# Patient Record
Sex: Female | Born: 1985
Health system: Southern US, Community
[De-identification: ages and names within clinical notes are randomized; demographics above are authoritative.]

## PROBLEM LIST (undated history)

## (undated) DIAGNOSIS — F32A Depression, unspecified: Secondary | ICD-10-CM

## (undated) DIAGNOSIS — I10 Essential (primary) hypertension: Secondary | ICD-10-CM

## (undated) DIAGNOSIS — J45909 Unspecified asthma, uncomplicated: Secondary | ICD-10-CM

## (undated) DIAGNOSIS — E119 Type 2 diabetes mellitus without complications: Secondary | ICD-10-CM

## (undated) DIAGNOSIS — E785 Hyperlipidemia, unspecified: Secondary | ICD-10-CM

## (undated) DIAGNOSIS — K7460 Unspecified cirrhosis of liver: Secondary | ICD-10-CM

## (undated) DIAGNOSIS — E039 Hypothyroidism, unspecified: Secondary | ICD-10-CM

## (undated) DIAGNOSIS — R079 Chest pain, unspecified: Secondary | ICD-10-CM

## (undated) DIAGNOSIS — F419 Anxiety disorder, unspecified: Secondary | ICD-10-CM

## (undated) DIAGNOSIS — M109 Gout, unspecified: Secondary | ICD-10-CM

## (undated) DIAGNOSIS — R202 Paresthesia of skin: Secondary | ICD-10-CM

## (undated) DIAGNOSIS — F329 Major depressive disorder, single episode, unspecified: Secondary | ICD-10-CM

## (undated) DIAGNOSIS — N184 Chronic kidney disease, stage 4 (severe): Secondary | ICD-10-CM

## (undated) DIAGNOSIS — N189 Chronic kidney disease, unspecified: Secondary | ICD-10-CM

## (undated) HISTORY — DX: Chronic kidney disease, stage 4 (severe): N18.4

## (undated) HISTORY — DX: Essential (primary) hypertension: I10

## (undated) HISTORY — DX: Unspecified asthma, uncomplicated: J45.909

## (undated) HISTORY — DX: Chronic kidney disease, unspecified: N18.9

## (undated) HISTORY — DX: Type 2 diabetes mellitus without complications: E11.9

## (undated) HISTORY — DX: Anxiety disorder, unspecified: F41.9

## (undated) HISTORY — DX: Hyperlipidemia, unspecified: E78.5

## (undated) HISTORY — DX: Major depressive disorder, single episode, unspecified: F32.9

## (undated) HISTORY — DX: Depression, unspecified: F32.A

## (undated) HISTORY — DX: Paresthesia of skin: R20.2

## (undated) HISTORY — DX: Chest pain, unspecified: R07.9

## (undated) HISTORY — DX: Hypothyroidism, unspecified: E03.9

## (undated) HISTORY — DX: Gout, unspecified: M10.9

## (undated) HISTORY — DX: Unspecified cirrhosis of liver: K74.60

---

## 2010-05-11 ENCOUNTER — Ambulatory Visit: Payer: Self-pay | Admitting: Genetic Counselor

## 2011-04-05 DIAGNOSIS — K2100 Gastro-esophageal reflux disease with esophagitis, without bleeding: Secondary | ICD-10-CM | POA: Insufficient documentation

## 2011-04-13 ENCOUNTER — Other Ambulatory Visit: Payer: Self-pay | Admitting: Neurosurgery

## 2011-04-13 DIAGNOSIS — M5126 Other intervertebral disc displacement, lumbar region: Secondary | ICD-10-CM

## 2011-04-14 ENCOUNTER — Ambulatory Visit
Admission: RE | Admit: 2011-04-14 | Discharge: 2011-04-14 | Disposition: A | Discharge status: Home / Self Care | Payer: Medicare Other | Source: Ambulatory Visit | Attending: Neurosurgery | Admitting: Neurosurgery

## 2011-04-14 DIAGNOSIS — M5126 Other intervertebral disc displacement, lumbar region: Secondary | ICD-10-CM

## 2011-07-26 ENCOUNTER — Other Ambulatory Visit: Payer: Self-pay | Admitting: Neurosurgery

## 2011-07-26 DIAGNOSIS — M5126 Other intervertebral disc displacement, lumbar region: Secondary | ICD-10-CM

## 2012-01-17 DIAGNOSIS — G4700 Insomnia, unspecified: Secondary | ICD-10-CM | POA: Diagnosis not present

## 2012-01-17 DIAGNOSIS — R209 Unspecified disturbances of skin sensation: Secondary | ICD-10-CM | POA: Diagnosis not present

## 2012-01-17 DIAGNOSIS — M545 Low back pain: Secondary | ICD-10-CM | POA: Diagnosis not present

## 2012-01-17 DIAGNOSIS — IMO0002 Reserved for concepts with insufficient information to code with codable children: Secondary | ICD-10-CM | POA: Diagnosis not present

## 2012-02-07 DIAGNOSIS — J019 Acute sinusitis, unspecified: Secondary | ICD-10-CM | POA: Diagnosis not present

## 2012-08-07 DIAGNOSIS — E669 Obesity, unspecified: Secondary | ICD-10-CM | POA: Diagnosis not present

## 2012-08-07 DIAGNOSIS — J45902 Unspecified asthma with status asthmaticus: Secondary | ICD-10-CM | POA: Diagnosis not present

## 2012-08-07 DIAGNOSIS — R300 Dysuria: Secondary | ICD-10-CM | POA: Diagnosis not present

## 2012-08-07 DIAGNOSIS — Z3202 Encounter for pregnancy test, result negative: Secondary | ICD-10-CM | POA: Diagnosis not present

## 2012-08-07 DIAGNOSIS — I10 Essential (primary) hypertension: Secondary | ICD-10-CM | POA: Diagnosis not present

## 2012-08-07 DIAGNOSIS — M109 Gout, unspecified: Secondary | ICD-10-CM | POA: Diagnosis not present

## 2012-08-07 DIAGNOSIS — E782 Mixed hyperlipidemia: Secondary | ICD-10-CM | POA: Insufficient documentation

## 2012-08-07 DIAGNOSIS — N3090 Cystitis, unspecified without hematuria: Secondary | ICD-10-CM | POA: Diagnosis not present

## 2013-03-20 DIAGNOSIS — M109 Gout, unspecified: Secondary | ICD-10-CM | POA: Insufficient documentation

## 2013-03-22 DIAGNOSIS — I10 Essential (primary) hypertension: Secondary | ICD-10-CM | POA: Diagnosis not present

## 2013-03-22 DIAGNOSIS — M109 Gout, unspecified: Secondary | ICD-10-CM | POA: Diagnosis not present

## 2013-04-04 DIAGNOSIS — M79609 Pain in unspecified limb: Secondary | ICD-10-CM | POA: Diagnosis not present

## 2013-04-04 DIAGNOSIS — M109 Gout, unspecified: Secondary | ICD-10-CM | POA: Diagnosis not present

## 2013-04-11 DIAGNOSIS — Z79899 Other long term (current) drug therapy: Secondary | ICD-10-CM | POA: Diagnosis not present

## 2013-04-11 DIAGNOSIS — J111 Influenza due to unidentified influenza virus with other respiratory manifestations: Secondary | ICD-10-CM | POA: Diagnosis not present

## 2013-04-11 DIAGNOSIS — N390 Urinary tract infection, site not specified: Secondary | ICD-10-CM | POA: Diagnosis not present

## 2013-04-11 DIAGNOSIS — I10 Essential (primary) hypertension: Secondary | ICD-10-CM | POA: Diagnosis not present

## 2013-05-14 DIAGNOSIS — I10 Essential (primary) hypertension: Secondary | ICD-10-CM | POA: Diagnosis not present

## 2013-05-14 DIAGNOSIS — M109 Gout, unspecified: Secondary | ICD-10-CM | POA: Diagnosis not present

## 2013-09-12 DIAGNOSIS — C44320 Squamous cell carcinoma of skin of unspecified parts of face: Secondary | ICD-10-CM | POA: Diagnosis not present

## 2013-09-12 DIAGNOSIS — H6120 Impacted cerumen, unspecified ear: Secondary | ICD-10-CM | POA: Diagnosis not present

## 2013-09-12 DIAGNOSIS — F411 Generalized anxiety disorder: Secondary | ICD-10-CM | POA: Diagnosis not present

## 2013-09-12 DIAGNOSIS — B079 Viral wart, unspecified: Secondary | ICD-10-CM | POA: Diagnosis not present

## 2013-09-27 DIAGNOSIS — L259 Unspecified contact dermatitis, unspecified cause: Secondary | ICD-10-CM | POA: Diagnosis not present

## 2013-09-27 DIAGNOSIS — Z23 Encounter for immunization: Secondary | ICD-10-CM | POA: Diagnosis not present

## 2013-11-19 DIAGNOSIS — H6120 Impacted cerumen, unspecified ear: Secondary | ICD-10-CM | POA: Diagnosis not present

## 2013-11-19 DIAGNOSIS — H6690 Otitis media, unspecified, unspecified ear: Secondary | ICD-10-CM | POA: Diagnosis not present

## 2013-11-25 DIAGNOSIS — J309 Allergic rhinitis, unspecified: Secondary | ICD-10-CM | POA: Diagnosis not present

## 2013-11-25 DIAGNOSIS — H6690 Otitis media, unspecified, unspecified ear: Secondary | ICD-10-CM | POA: Diagnosis not present

## 2013-12-25 DIAGNOSIS — H9209 Otalgia, unspecified ear: Secondary | ICD-10-CM | POA: Diagnosis not present

## 2013-12-25 DIAGNOSIS — H6120 Impacted cerumen, unspecified ear: Secondary | ICD-10-CM | POA: Diagnosis not present

## 2014-03-08 DIAGNOSIS — Z87891 Personal history of nicotine dependence: Secondary | ICD-10-CM | POA: Diagnosis not present

## 2014-03-08 DIAGNOSIS — I10 Essential (primary) hypertension: Secondary | ICD-10-CM | POA: Diagnosis not present

## 2014-03-08 DIAGNOSIS — R109 Unspecified abdominal pain: Secondary | ICD-10-CM | POA: Diagnosis not present

## 2014-03-08 DIAGNOSIS — R1033 Periumbilical pain: Secondary | ICD-10-CM | POA: Diagnosis not present

## 2014-03-08 DIAGNOSIS — R112 Nausea with vomiting, unspecified: Secondary | ICD-10-CM | POA: Diagnosis not present

## 2014-03-08 DIAGNOSIS — Z79899 Other long term (current) drug therapy: Secondary | ICD-10-CM | POA: Diagnosis not present

## 2014-03-08 DIAGNOSIS — F411 Generalized anxiety disorder: Secondary | ICD-10-CM | POA: Diagnosis not present

## 2014-03-08 DIAGNOSIS — E7800 Pure hypercholesterolemia, unspecified: Secondary | ICD-10-CM | POA: Diagnosis not present

## 2014-03-12 DIAGNOSIS — E669 Obesity, unspecified: Secondary | ICD-10-CM | POA: Diagnosis not present

## 2014-03-12 DIAGNOSIS — E782 Mixed hyperlipidemia: Secondary | ICD-10-CM | POA: Diagnosis not present

## 2014-03-12 DIAGNOSIS — E039 Hypothyroidism, unspecified: Secondary | ICD-10-CM | POA: Diagnosis not present

## 2014-03-12 DIAGNOSIS — IMO0001 Reserved for inherently not codable concepts without codable children: Secondary | ICD-10-CM | POA: Diagnosis not present

## 2014-03-12 DIAGNOSIS — M109 Gout, unspecified: Secondary | ICD-10-CM | POA: Diagnosis not present

## 2014-03-12 DIAGNOSIS — J45902 Unspecified asthma with status asthmaticus: Secondary | ICD-10-CM | POA: Diagnosis not present

## 2014-03-12 DIAGNOSIS — I10 Essential (primary) hypertension: Secondary | ICD-10-CM | POA: Diagnosis not present

## 2014-04-14 DIAGNOSIS — E039 Hypothyroidism, unspecified: Secondary | ICD-10-CM | POA: Diagnosis not present

## 2014-04-14 DIAGNOSIS — IMO0001 Reserved for inherently not codable concepts without codable children: Secondary | ICD-10-CM | POA: Diagnosis not present

## 2014-04-14 DIAGNOSIS — E782 Mixed hyperlipidemia: Secondary | ICD-10-CM | POA: Diagnosis not present

## 2014-04-14 DIAGNOSIS — M109 Gout, unspecified: Secondary | ICD-10-CM | POA: Diagnosis not present

## 2014-04-14 DIAGNOSIS — J45902 Unspecified asthma with status asthmaticus: Secondary | ICD-10-CM | POA: Diagnosis not present

## 2014-04-14 DIAGNOSIS — I10 Essential (primary) hypertension: Secondary | ICD-10-CM | POA: Diagnosis not present

## 2014-04-14 DIAGNOSIS — E669 Obesity, unspecified: Secondary | ICD-10-CM | POA: Diagnosis not present

## 2014-04-28 DIAGNOSIS — N926 Irregular menstruation, unspecified: Secondary | ICD-10-CM | POA: Diagnosis not present

## 2014-04-28 DIAGNOSIS — E039 Hypothyroidism, unspecified: Secondary | ICD-10-CM | POA: Diagnosis not present

## 2014-04-28 DIAGNOSIS — N939 Abnormal uterine and vaginal bleeding, unspecified: Secondary | ICD-10-CM | POA: Diagnosis not present

## 2014-04-28 DIAGNOSIS — Z3202 Encounter for pregnancy test, result negative: Secondary | ICD-10-CM | POA: Diagnosis not present

## 2014-05-02 DIAGNOSIS — D259 Leiomyoma of uterus, unspecified: Secondary | ICD-10-CM | POA: Diagnosis not present

## 2014-05-02 DIAGNOSIS — E039 Hypothyroidism, unspecified: Secondary | ICD-10-CM | POA: Diagnosis not present

## 2014-05-02 DIAGNOSIS — N926 Irregular menstruation, unspecified: Secondary | ICD-10-CM | POA: Diagnosis not present

## 2014-05-20 DIAGNOSIS — H6500 Acute serous otitis media, unspecified ear: Secondary | ICD-10-CM | POA: Diagnosis not present

## 2014-05-24 DIAGNOSIS — R059 Cough, unspecified: Secondary | ICD-10-CM | POA: Diagnosis not present

## 2014-05-24 DIAGNOSIS — Z79899 Other long term (current) drug therapy: Secondary | ICD-10-CM | POA: Diagnosis not present

## 2014-05-24 DIAGNOSIS — F411 Generalized anxiety disorder: Secondary | ICD-10-CM | POA: Diagnosis not present

## 2014-05-24 DIAGNOSIS — I10 Essential (primary) hypertension: Secondary | ICD-10-CM | POA: Diagnosis not present

## 2014-05-24 DIAGNOSIS — R05 Cough: Secondary | ICD-10-CM | POA: Diagnosis not present

## 2014-05-24 DIAGNOSIS — J45909 Unspecified asthma, uncomplicated: Secondary | ICD-10-CM | POA: Diagnosis not present

## 2014-05-24 DIAGNOSIS — Z862 Personal history of diseases of the blood and blood-forming organs and certain disorders involving the immune mechanism: Secondary | ICD-10-CM | POA: Diagnosis not present

## 2014-05-24 DIAGNOSIS — E7800 Pure hypercholesterolemia, unspecified: Secondary | ICD-10-CM | POA: Diagnosis not present

## 2014-05-24 DIAGNOSIS — Z8639 Personal history of other endocrine, nutritional and metabolic disease: Secondary | ICD-10-CM | POA: Diagnosis not present

## 2014-05-24 DIAGNOSIS — J069 Acute upper respiratory infection, unspecified: Secondary | ICD-10-CM | POA: Diagnosis not present

## 2014-05-26 DIAGNOSIS — R062 Wheezing: Secondary | ICD-10-CM | POA: Diagnosis not present

## 2014-05-26 DIAGNOSIS — J45901 Unspecified asthma with (acute) exacerbation: Secondary | ICD-10-CM | POA: Diagnosis not present

## 2014-05-26 DIAGNOSIS — R05 Cough: Secondary | ICD-10-CM | POA: Diagnosis not present

## 2014-05-26 DIAGNOSIS — R059 Cough, unspecified: Secondary | ICD-10-CM | POA: Diagnosis not present

## 2014-05-27 DIAGNOSIS — R059 Cough, unspecified: Secondary | ICD-10-CM | POA: Diagnosis not present

## 2014-05-27 DIAGNOSIS — R062 Wheezing: Secondary | ICD-10-CM | POA: Diagnosis not present

## 2014-05-27 DIAGNOSIS — R05 Cough: Secondary | ICD-10-CM | POA: Diagnosis not present

## 2014-05-27 DIAGNOSIS — J45901 Unspecified asthma with (acute) exacerbation: Secondary | ICD-10-CM | POA: Diagnosis not present

## 2014-08-21 DIAGNOSIS — Z23 Encounter for immunization: Secondary | ICD-10-CM | POA: Diagnosis not present

## 2014-08-21 DIAGNOSIS — E782 Mixed hyperlipidemia: Secondary | ICD-10-CM | POA: Diagnosis not present

## 2014-08-21 DIAGNOSIS — M109 Gout, unspecified: Secondary | ICD-10-CM | POA: Diagnosis not present

## 2014-08-21 DIAGNOSIS — E669 Obesity, unspecified: Secondary | ICD-10-CM | POA: Diagnosis not present

## 2014-08-21 DIAGNOSIS — I10 Essential (primary) hypertension: Secondary | ICD-10-CM | POA: Diagnosis not present

## 2014-08-21 DIAGNOSIS — E039 Hypothyroidism, unspecified: Secondary | ICD-10-CM | POA: Diagnosis not present

## 2014-08-21 DIAGNOSIS — IMO0001 Reserved for inherently not codable concepts without codable children: Secondary | ICD-10-CM | POA: Diagnosis not present

## 2014-08-21 DIAGNOSIS — J45902 Unspecified asthma with status asthmaticus: Secondary | ICD-10-CM | POA: Diagnosis not present

## 2014-09-13 DIAGNOSIS — M25571 Pain in right ankle and joints of right foot: Secondary | ICD-10-CM | POA: Diagnosis not present

## 2014-09-13 DIAGNOSIS — I10 Essential (primary) hypertension: Secondary | ICD-10-CM | POA: Diagnosis not present

## 2014-09-13 DIAGNOSIS — M25572 Pain in left ankle and joints of left foot: Secondary | ICD-10-CM | POA: Diagnosis not present

## 2014-09-13 DIAGNOSIS — Z87891 Personal history of nicotine dependence: Secondary | ICD-10-CM | POA: Diagnosis not present

## 2014-09-13 DIAGNOSIS — K219 Gastro-esophageal reflux disease without esophagitis: Secondary | ICD-10-CM | POA: Diagnosis not present

## 2014-09-13 DIAGNOSIS — M109 Gout, unspecified: Secondary | ICD-10-CM | POA: Diagnosis not present

## 2014-09-13 DIAGNOSIS — Z79899 Other long term (current) drug therapy: Secondary | ICD-10-CM | POA: Diagnosis not present

## 2014-09-15 DIAGNOSIS — D259 Leiomyoma of uterus, unspecified: Secondary | ICD-10-CM | POA: Diagnosis not present

## 2014-09-15 DIAGNOSIS — N912 Amenorrhea, unspecified: Secondary | ICD-10-CM | POA: Diagnosis not present

## 2014-09-15 DIAGNOSIS — K143 Hypertrophy of tongue papillae: Secondary | ICD-10-CM | POA: Diagnosis not present

## 2014-09-15 DIAGNOSIS — K120 Recurrent oral aphthae: Secondary | ICD-10-CM | POA: Diagnosis not present

## 2014-10-01 DIAGNOSIS — I10 Essential (primary) hypertension: Secondary | ICD-10-CM | POA: Diagnosis not present

## 2014-10-01 DIAGNOSIS — B370 Candidal stomatitis: Secondary | ICD-10-CM | POA: Diagnosis not present

## 2014-10-01 DIAGNOSIS — M109 Gout, unspecified: Secondary | ICD-10-CM | POA: Diagnosis not present

## 2014-10-01 DIAGNOSIS — E1165 Type 2 diabetes mellitus with hyperglycemia: Secondary | ICD-10-CM | POA: Diagnosis not present

## 2014-10-01 DIAGNOSIS — E039 Hypothyroidism, unspecified: Secondary | ICD-10-CM | POA: Diagnosis not present

## 2014-11-10 DIAGNOSIS — M1A09X0 Idiopathic chronic gout, multiple sites, without tophus (tophi): Secondary | ICD-10-CM | POA: Diagnosis not present

## 2014-11-12 DIAGNOSIS — M545 Low back pain: Secondary | ICD-10-CM | POA: Diagnosis not present

## 2014-11-12 DIAGNOSIS — R8290 Unspecified abnormal findings in urine: Secondary | ICD-10-CM | POA: Diagnosis not present

## 2015-04-12 DIAGNOSIS — J45909 Unspecified asthma, uncomplicated: Secondary | ICD-10-CM | POA: Diagnosis not present

## 2015-04-12 DIAGNOSIS — M79671 Pain in right foot: Secondary | ICD-10-CM | POA: Diagnosis not present

## 2015-04-12 DIAGNOSIS — Z87891 Personal history of nicotine dependence: Secondary | ICD-10-CM | POA: Diagnosis not present

## 2015-04-12 DIAGNOSIS — M1A00X0 Idiopathic chronic gout, unspecified site, without tophus (tophi): Secondary | ICD-10-CM | POA: Diagnosis not present

## 2015-04-12 DIAGNOSIS — I10 Essential (primary) hypertension: Secondary | ICD-10-CM | POA: Diagnosis not present

## 2015-04-12 DIAGNOSIS — M10071 Idiopathic gout, right ankle and foot: Secondary | ICD-10-CM | POA: Diagnosis not present

## 2015-04-12 DIAGNOSIS — Z79899 Other long term (current) drug therapy: Secondary | ICD-10-CM | POA: Diagnosis not present

## 2015-04-16 DIAGNOSIS — M109 Gout, unspecified: Secondary | ICD-10-CM | POA: Diagnosis not present

## 2015-04-16 DIAGNOSIS — I10 Essential (primary) hypertension: Secondary | ICD-10-CM | POA: Diagnosis not present

## 2015-04-16 DIAGNOSIS — F419 Anxiety disorder, unspecified: Secondary | ICD-10-CM | POA: Diagnosis not present

## 2015-04-16 DIAGNOSIS — Z1389 Encounter for screening for other disorder: Secondary | ICD-10-CM | POA: Diagnosis not present

## 2015-04-16 DIAGNOSIS — E039 Hypothyroidism, unspecified: Secondary | ICD-10-CM | POA: Diagnosis not present

## 2015-04-16 DIAGNOSIS — E782 Mixed hyperlipidemia: Secondary | ICD-10-CM | POA: Diagnosis not present

## 2015-04-16 DIAGNOSIS — K121 Other forms of stomatitis: Secondary | ICD-10-CM | POA: Diagnosis not present

## 2015-04-16 DIAGNOSIS — E669 Obesity, unspecified: Secondary | ICD-10-CM | POA: Diagnosis not present

## 2015-04-16 DIAGNOSIS — E1165 Type 2 diabetes mellitus with hyperglycemia: Secondary | ICD-10-CM | POA: Diagnosis not present

## 2015-04-27 DIAGNOSIS — E1165 Type 2 diabetes mellitus with hyperglycemia: Secondary | ICD-10-CM | POA: Diagnosis not present

## 2015-05-19 DIAGNOSIS — J019 Acute sinusitis, unspecified: Secondary | ICD-10-CM | POA: Diagnosis not present

## 2015-05-21 DIAGNOSIS — J45901 Unspecified asthma with (acute) exacerbation: Secondary | ICD-10-CM | POA: Insufficient documentation

## 2015-05-22 DIAGNOSIS — J4521 Mild intermittent asthma with (acute) exacerbation: Secondary | ICD-10-CM | POA: Diagnosis not present

## 2015-05-22 DIAGNOSIS — J019 Acute sinusitis, unspecified: Secondary | ICD-10-CM | POA: Diagnosis not present

## 2015-05-25 DIAGNOSIS — D696 Thrombocytopenia, unspecified: Secondary | ICD-10-CM | POA: Diagnosis not present

## 2015-05-25 DIAGNOSIS — K7581 Nonalcoholic steatohepatitis (NASH): Secondary | ICD-10-CM | POA: Diagnosis not present

## 2015-05-25 DIAGNOSIS — M1A00X0 Idiopathic chronic gout, unspecified site, without tophus (tophi): Secondary | ICD-10-CM | POA: Diagnosis not present

## 2015-05-25 DIAGNOSIS — Z79899 Other long term (current) drug therapy: Secondary | ICD-10-CM | POA: Diagnosis not present

## 2015-05-25 DIAGNOSIS — Z6841 Body Mass Index (BMI) 40.0 and over, adult: Secondary | ICD-10-CM | POA: Diagnosis not present

## 2015-05-25 DIAGNOSIS — J45909 Unspecified asthma, uncomplicated: Secondary | ICD-10-CM | POA: Diagnosis not present

## 2015-05-25 DIAGNOSIS — R739 Hyperglycemia, unspecified: Secondary | ICD-10-CM | POA: Diagnosis not present

## 2015-05-25 DIAGNOSIS — Z882 Allergy status to sulfonamides status: Secondary | ICD-10-CM | POA: Diagnosis not present

## 2015-05-25 DIAGNOSIS — Z7952 Long term (current) use of systemic steroids: Secondary | ICD-10-CM | POA: Diagnosis not present

## 2015-05-25 DIAGNOSIS — G4700 Insomnia, unspecified: Secondary | ICD-10-CM | POA: Diagnosis not present

## 2015-05-25 DIAGNOSIS — Z7951 Long term (current) use of inhaled steroids: Secondary | ICD-10-CM | POA: Diagnosis not present

## 2015-05-25 DIAGNOSIS — F419 Anxiety disorder, unspecified: Secondary | ICD-10-CM | POA: Diagnosis not present

## 2015-05-25 DIAGNOSIS — K219 Gastro-esophageal reflux disease without esophagitis: Secondary | ICD-10-CM | POA: Diagnosis not present

## 2015-05-25 DIAGNOSIS — E785 Hyperlipidemia, unspecified: Secondary | ICD-10-CM | POA: Diagnosis not present

## 2015-05-25 DIAGNOSIS — E282 Polycystic ovarian syndrome: Secondary | ICD-10-CM | POA: Diagnosis not present

## 2015-05-25 DIAGNOSIS — E119 Type 2 diabetes mellitus without complications: Secondary | ICD-10-CM | POA: Diagnosis not present

## 2015-05-25 DIAGNOSIS — N179 Acute kidney failure, unspecified: Secondary | ICD-10-CM | POA: Diagnosis not present

## 2015-05-25 DIAGNOSIS — I10 Essential (primary) hypertension: Secondary | ICD-10-CM | POA: Diagnosis not present

## 2015-05-25 DIAGNOSIS — J449 Chronic obstructive pulmonary disease, unspecified: Secondary | ICD-10-CM | POA: Diagnosis not present

## 2015-05-26 DIAGNOSIS — N170 Acute kidney failure with tubular necrosis: Secondary | ICD-10-CM | POA: Diagnosis not present

## 2015-05-27 DIAGNOSIS — I10 Essential (primary) hypertension: Secondary | ICD-10-CM | POA: Diagnosis not present

## 2015-05-27 DIAGNOSIS — E1165 Type 2 diabetes mellitus with hyperglycemia: Secondary | ICD-10-CM | POA: Diagnosis not present

## 2015-05-27 DIAGNOSIS — E860 Dehydration: Secondary | ICD-10-CM | POA: Diagnosis not present

## 2015-05-27 DIAGNOSIS — M109 Gout, unspecified: Secondary | ICD-10-CM | POA: Diagnosis not present

## 2015-05-27 DIAGNOSIS — E669 Obesity, unspecified: Secondary | ICD-10-CM | POA: Diagnosis not present

## 2015-05-27 DIAGNOSIS — E039 Hypothyroidism, unspecified: Secondary | ICD-10-CM | POA: Diagnosis not present

## 2015-05-27 DIAGNOSIS — F419 Anxiety disorder, unspecified: Secondary | ICD-10-CM | POA: Diagnosis not present

## 2015-05-27 DIAGNOSIS — E782 Mixed hyperlipidemia: Secondary | ICD-10-CM | POA: Diagnosis not present

## 2015-05-27 DIAGNOSIS — R197 Diarrhea, unspecified: Secondary | ICD-10-CM | POA: Diagnosis not present

## 2015-05-28 DIAGNOSIS — E1165 Type 2 diabetes mellitus with hyperglycemia: Secondary | ICD-10-CM | POA: Diagnosis not present

## 2015-06-25 DIAGNOSIS — E782 Mixed hyperlipidemia: Secondary | ICD-10-CM | POA: Diagnosis not present

## 2015-06-25 DIAGNOSIS — M109 Gout, unspecified: Secondary | ICD-10-CM | POA: Diagnosis not present

## 2015-06-25 DIAGNOSIS — I10 Essential (primary) hypertension: Secondary | ICD-10-CM | POA: Diagnosis not present

## 2015-06-25 DIAGNOSIS — E1165 Type 2 diabetes mellitus with hyperglycemia: Secondary | ICD-10-CM | POA: Diagnosis not present

## 2015-06-25 DIAGNOSIS — R197 Diarrhea, unspecified: Secondary | ICD-10-CM | POA: Diagnosis not present

## 2015-06-25 DIAGNOSIS — E669 Obesity, unspecified: Secondary | ICD-10-CM | POA: Diagnosis not present

## 2015-06-25 DIAGNOSIS — F419 Anxiety disorder, unspecified: Secondary | ICD-10-CM | POA: Diagnosis not present

## 2015-06-25 DIAGNOSIS — E039 Hypothyroidism, unspecified: Secondary | ICD-10-CM | POA: Diagnosis not present

## 2015-06-25 DIAGNOSIS — E860 Dehydration: Secondary | ICD-10-CM | POA: Diagnosis not present

## 2015-06-27 DIAGNOSIS — E780 Pure hypercholesterolemia: Secondary | ICD-10-CM | POA: Diagnosis not present

## 2015-07-28 DIAGNOSIS — E780 Pure hypercholesterolemia: Secondary | ICD-10-CM | POA: Diagnosis not present

## 2015-08-20 DIAGNOSIS — F419 Anxiety disorder, unspecified: Secondary | ICD-10-CM | POA: Diagnosis not present

## 2015-08-20 DIAGNOSIS — M25552 Pain in left hip: Secondary | ICD-10-CM | POA: Diagnosis not present

## 2015-08-20 DIAGNOSIS — E782 Mixed hyperlipidemia: Secondary | ICD-10-CM | POA: Diagnosis not present

## 2015-08-20 DIAGNOSIS — E039 Hypothyroidism, unspecified: Secondary | ICD-10-CM | POA: Diagnosis not present

## 2015-08-20 DIAGNOSIS — B079 Viral wart, unspecified: Secondary | ICD-10-CM | POA: Diagnosis not present

## 2015-08-20 DIAGNOSIS — M109 Gout, unspecified: Secondary | ICD-10-CM | POA: Diagnosis not present

## 2015-08-20 DIAGNOSIS — F329 Major depressive disorder, single episode, unspecified: Secondary | ICD-10-CM | POA: Diagnosis not present

## 2015-08-20 DIAGNOSIS — I10 Essential (primary) hypertension: Secondary | ICD-10-CM | POA: Diagnosis not present

## 2015-08-20 DIAGNOSIS — E1165 Type 2 diabetes mellitus with hyperglycemia: Secondary | ICD-10-CM | POA: Diagnosis not present

## 2015-08-26 DIAGNOSIS — R7309 Other abnormal glucose: Secondary | ICD-10-CM | POA: Diagnosis not present

## 2015-08-26 DIAGNOSIS — J45909 Unspecified asthma, uncomplicated: Secondary | ICD-10-CM | POA: Diagnosis not present

## 2015-08-26 DIAGNOSIS — Z79899 Other long term (current) drug therapy: Secondary | ICD-10-CM | POA: Diagnosis not present

## 2015-08-26 DIAGNOSIS — I10 Essential (primary) hypertension: Secondary | ICD-10-CM | POA: Diagnosis not present

## 2015-08-26 DIAGNOSIS — G8929 Other chronic pain: Secondary | ICD-10-CM | POA: Diagnosis not present

## 2015-08-26 DIAGNOSIS — M25561 Pain in right knee: Secondary | ICD-10-CM | POA: Diagnosis not present

## 2015-08-26 DIAGNOSIS — Z7952 Long term (current) use of systemic steroids: Secondary | ICD-10-CM | POA: Diagnosis not present

## 2015-08-28 DIAGNOSIS — E039 Hypothyroidism, unspecified: Secondary | ICD-10-CM | POA: Diagnosis not present

## 2015-08-30 DIAGNOSIS — F419 Anxiety disorder, unspecified: Secondary | ICD-10-CM | POA: Diagnosis not present

## 2015-08-30 DIAGNOSIS — Z7952 Long term (current) use of systemic steroids: Secondary | ICD-10-CM | POA: Diagnosis not present

## 2015-08-30 DIAGNOSIS — M109 Gout, unspecified: Secondary | ICD-10-CM | POA: Diagnosis not present

## 2015-08-30 DIAGNOSIS — J45909 Unspecified asthma, uncomplicated: Secondary | ICD-10-CM | POA: Diagnosis not present

## 2015-08-30 DIAGNOSIS — I10 Essential (primary) hypertension: Secondary | ICD-10-CM | POA: Diagnosis not present

## 2015-08-30 DIAGNOSIS — E780 Pure hypercholesterolemia: Secondary | ICD-10-CM | POA: Diagnosis not present

## 2015-08-30 DIAGNOSIS — E039 Hypothyroidism, unspecified: Secondary | ICD-10-CM | POA: Diagnosis not present

## 2015-08-30 DIAGNOSIS — K219 Gastro-esophageal reflux disease without esophagitis: Secondary | ICD-10-CM | POA: Diagnosis not present

## 2015-08-30 DIAGNOSIS — M25571 Pain in right ankle and joints of right foot: Secondary | ICD-10-CM | POA: Diagnosis not present

## 2015-08-30 DIAGNOSIS — Z79899 Other long term (current) drug therapy: Secondary | ICD-10-CM | POA: Diagnosis not present

## 2015-08-30 DIAGNOSIS — M10071 Idiopathic gout, right ankle and foot: Secondary | ICD-10-CM | POA: Diagnosis not present

## 2015-08-30 DIAGNOSIS — Z87891 Personal history of nicotine dependence: Secondary | ICD-10-CM | POA: Diagnosis not present

## 2015-09-18 DIAGNOSIS — E109 Type 1 diabetes mellitus without complications: Secondary | ICD-10-CM | POA: Diagnosis not present

## 2015-09-18 DIAGNOSIS — Z0100 Encounter for examination of eyes and vision without abnormal findings: Secondary | ICD-10-CM | POA: Diagnosis not present

## 2015-09-27 DIAGNOSIS — J45909 Unspecified asthma, uncomplicated: Secondary | ICD-10-CM | POA: Diagnosis not present

## 2015-10-02 DIAGNOSIS — M25552 Pain in left hip: Secondary | ICD-10-CM | POA: Diagnosis not present

## 2015-10-02 DIAGNOSIS — Z23 Encounter for immunization: Secondary | ICD-10-CM | POA: Diagnosis not present

## 2015-10-29 DIAGNOSIS — E119 Type 2 diabetes mellitus without complications: Secondary | ICD-10-CM | POA: Diagnosis not present

## 2015-10-29 DIAGNOSIS — J45909 Unspecified asthma, uncomplicated: Secondary | ICD-10-CM | POA: Diagnosis not present

## 2015-11-16 DIAGNOSIS — R05 Cough: Secondary | ICD-10-CM | POA: Diagnosis not present

## 2015-11-16 DIAGNOSIS — G4700 Insomnia, unspecified: Secondary | ICD-10-CM | POA: Diagnosis not present

## 2015-11-16 DIAGNOSIS — Z760 Encounter for issue of repeat prescription: Secondary | ICD-10-CM | POA: Diagnosis not present

## 2015-11-28 DIAGNOSIS — J45909 Unspecified asthma, uncomplicated: Secondary | ICD-10-CM | POA: Diagnosis not present

## 2015-12-02 DIAGNOSIS — E039 Hypothyroidism, unspecified: Secondary | ICD-10-CM | POA: Diagnosis not present

## 2015-12-02 DIAGNOSIS — F419 Anxiety disorder, unspecified: Secondary | ICD-10-CM | POA: Diagnosis not present

## 2015-12-02 DIAGNOSIS — M4306 Spondylolysis, lumbar region: Secondary | ICD-10-CM | POA: Diagnosis not present

## 2015-12-02 DIAGNOSIS — M109 Gout, unspecified: Secondary | ICD-10-CM | POA: Diagnosis not present

## 2015-12-02 DIAGNOSIS — M5136 Other intervertebral disc degeneration, lumbar region: Secondary | ICD-10-CM | POA: Diagnosis not present

## 2015-12-02 DIAGNOSIS — M545 Low back pain: Secondary | ICD-10-CM | POA: Diagnosis not present

## 2015-12-02 DIAGNOSIS — E1165 Type 2 diabetes mellitus with hyperglycemia: Secondary | ICD-10-CM | POA: Diagnosis not present

## 2015-12-02 DIAGNOSIS — E782 Mixed hyperlipidemia: Secondary | ICD-10-CM | POA: Diagnosis not present

## 2015-12-02 DIAGNOSIS — M9983 Other biomechanical lesions of lumbar region: Secondary | ICD-10-CM | POA: Diagnosis not present

## 2015-12-02 DIAGNOSIS — I10 Essential (primary) hypertension: Secondary | ICD-10-CM | POA: Diagnosis not present

## 2015-12-02 DIAGNOSIS — E669 Obesity, unspecified: Secondary | ICD-10-CM | POA: Diagnosis not present

## 2016-01-26 DIAGNOSIS — M4806 Spinal stenosis, lumbar region: Secondary | ICD-10-CM | POA: Diagnosis not present

## 2016-01-26 DIAGNOSIS — M5127 Other intervertebral disc displacement, lumbosacral region: Secondary | ICD-10-CM | POA: Diagnosis not present

## 2016-01-26 DIAGNOSIS — M5126 Other intervertebral disc displacement, lumbar region: Secondary | ICD-10-CM | POA: Diagnosis not present

## 2016-02-02 DIAGNOSIS — E782 Mixed hyperlipidemia: Secondary | ICD-10-CM | POA: Diagnosis not present

## 2016-02-02 DIAGNOSIS — F329 Major depressive disorder, single episode, unspecified: Secondary | ICD-10-CM | POA: Diagnosis not present

## 2016-02-02 DIAGNOSIS — I10 Essential (primary) hypertension: Secondary | ICD-10-CM | POA: Diagnosis not present

## 2016-02-02 DIAGNOSIS — M545 Low back pain: Secondary | ICD-10-CM | POA: Diagnosis not present

## 2016-02-02 DIAGNOSIS — F419 Anxiety disorder, unspecified: Secondary | ICD-10-CM | POA: Diagnosis not present

## 2016-02-02 DIAGNOSIS — E1165 Type 2 diabetes mellitus with hyperglycemia: Secondary | ICD-10-CM | POA: Diagnosis not present

## 2016-02-02 DIAGNOSIS — M109 Gout, unspecified: Secondary | ICD-10-CM | POA: Diagnosis not present

## 2016-02-02 DIAGNOSIS — E669 Obesity, unspecified: Secondary | ICD-10-CM | POA: Diagnosis not present

## 2016-02-02 DIAGNOSIS — E039 Hypothyroidism, unspecified: Secondary | ICD-10-CM | POA: Diagnosis not present

## 2016-02-04 DIAGNOSIS — Z6841 Body Mass Index (BMI) 40.0 and over, adult: Secondary | ICD-10-CM | POA: Diagnosis not present

## 2016-02-04 DIAGNOSIS — M549 Dorsalgia, unspecified: Secondary | ICD-10-CM | POA: Diagnosis not present

## 2016-02-04 DIAGNOSIS — R030 Elevated blood-pressure reading, without diagnosis of hypertension: Secondary | ICD-10-CM | POA: Diagnosis not present

## 2016-05-26 DIAGNOSIS — M109 Gout, unspecified: Secondary | ICD-10-CM | POA: Diagnosis not present

## 2016-05-26 DIAGNOSIS — M545 Low back pain: Secondary | ICD-10-CM | POA: Diagnosis not present

## 2016-05-26 DIAGNOSIS — F329 Major depressive disorder, single episode, unspecified: Secondary | ICD-10-CM | POA: Diagnosis not present

## 2016-05-26 DIAGNOSIS — E1165 Type 2 diabetes mellitus with hyperglycemia: Secondary | ICD-10-CM | POA: Diagnosis not present

## 2016-05-26 DIAGNOSIS — E039 Hypothyroidism, unspecified: Secondary | ICD-10-CM | POA: Diagnosis not present

## 2016-05-26 DIAGNOSIS — E669 Obesity, unspecified: Secondary | ICD-10-CM | POA: Diagnosis not present

## 2016-05-26 DIAGNOSIS — F419 Anxiety disorder, unspecified: Secondary | ICD-10-CM | POA: Diagnosis not present

## 2016-05-26 DIAGNOSIS — E782 Mixed hyperlipidemia: Secondary | ICD-10-CM | POA: Diagnosis not present

## 2016-05-26 DIAGNOSIS — I10 Essential (primary) hypertension: Secondary | ICD-10-CM | POA: Diagnosis not present

## 2016-06-20 DIAGNOSIS — R102 Pelvic and perineal pain: Secondary | ICD-10-CM | POA: Diagnosis not present

## 2016-06-20 DIAGNOSIS — E282 Polycystic ovarian syndrome: Secondary | ICD-10-CM | POA: Diagnosis not present

## 2016-06-20 DIAGNOSIS — N930 Postcoital and contact bleeding: Secondary | ICD-10-CM | POA: Diagnosis not present

## 2016-07-27 DIAGNOSIS — N926 Irregular menstruation, unspecified: Secondary | ICD-10-CM | POA: Diagnosis not present

## 2016-07-27 DIAGNOSIS — E282 Polycystic ovarian syndrome: Secondary | ICD-10-CM | POA: Diagnosis not present

## 2016-07-27 DIAGNOSIS — R102 Pelvic and perineal pain: Secondary | ICD-10-CM | POA: Diagnosis not present

## 2016-07-27 DIAGNOSIS — N930 Postcoital and contact bleeding: Secondary | ICD-10-CM | POA: Diagnosis not present

## 2016-07-27 DIAGNOSIS — N83201 Unspecified ovarian cyst, right side: Secondary | ICD-10-CM | POA: Diagnosis not present

## 2016-07-27 DIAGNOSIS — N941 Unspecified dyspareunia: Secondary | ICD-10-CM | POA: Diagnosis not present

## 2016-07-27 DIAGNOSIS — N83299 Other ovarian cyst, unspecified side: Secondary | ICD-10-CM | POA: Diagnosis not present

## 2016-09-01 DIAGNOSIS — E669 Obesity, unspecified: Secondary | ICD-10-CM | POA: Diagnosis not present

## 2016-09-01 DIAGNOSIS — Z6841 Body Mass Index (BMI) 40.0 and over, adult: Secondary | ICD-10-CM | POA: Diagnosis not present

## 2016-09-01 DIAGNOSIS — F329 Major depressive disorder, single episode, unspecified: Secondary | ICD-10-CM | POA: Diagnosis not present

## 2016-09-01 DIAGNOSIS — E1165 Type 2 diabetes mellitus with hyperglycemia: Secondary | ICD-10-CM | POA: Diagnosis not present

## 2016-09-01 DIAGNOSIS — F419 Anxiety disorder, unspecified: Secondary | ICD-10-CM | POA: Diagnosis not present

## 2016-09-01 DIAGNOSIS — E039 Hypothyroidism, unspecified: Secondary | ICD-10-CM | POA: Diagnosis not present

## 2016-09-01 DIAGNOSIS — Z23 Encounter for immunization: Secondary | ICD-10-CM | POA: Diagnosis not present

## 2016-09-01 DIAGNOSIS — Z1389 Encounter for screening for other disorder: Secondary | ICD-10-CM | POA: Diagnosis not present

## 2016-09-01 DIAGNOSIS — I10 Essential (primary) hypertension: Secondary | ICD-10-CM | POA: Diagnosis not present

## 2016-09-01 DIAGNOSIS — E782 Mixed hyperlipidemia: Secondary | ICD-10-CM | POA: Diagnosis not present

## 2016-09-01 DIAGNOSIS — M109 Gout, unspecified: Secondary | ICD-10-CM | POA: Diagnosis not present

## 2016-09-01 DIAGNOSIS — M545 Low back pain: Secondary | ICD-10-CM | POA: Diagnosis not present

## 2016-11-15 DIAGNOSIS — R350 Frequency of micturition: Secondary | ICD-10-CM | POA: Diagnosis not present

## 2016-11-15 DIAGNOSIS — B373 Candidiasis of vulva and vagina: Secondary | ICD-10-CM | POA: Diagnosis not present

## 2016-11-15 DIAGNOSIS — R05 Cough: Secondary | ICD-10-CM | POA: Diagnosis not present

## 2016-11-15 DIAGNOSIS — J9801 Acute bronchospasm: Secondary | ICD-10-CM | POA: Diagnosis not present

## 2016-11-15 DIAGNOSIS — Z6841 Body Mass Index (BMI) 40.0 and over, adult: Secondary | ICD-10-CM | POA: Diagnosis not present

## 2016-12-18 DIAGNOSIS — R5383 Other fatigue: Secondary | ICD-10-CM | POA: Insufficient documentation

## 2016-12-19 DIAGNOSIS — I10 Essential (primary) hypertension: Secondary | ICD-10-CM | POA: Diagnosis not present

## 2016-12-19 DIAGNOSIS — E669 Obesity, unspecified: Secondary | ICD-10-CM | POA: Diagnosis not present

## 2016-12-19 DIAGNOSIS — M545 Low back pain: Secondary | ICD-10-CM | POA: Diagnosis not present

## 2016-12-19 DIAGNOSIS — M109 Gout, unspecified: Secondary | ICD-10-CM | POA: Diagnosis not present

## 2016-12-19 DIAGNOSIS — E039 Hypothyroidism, unspecified: Secondary | ICD-10-CM | POA: Diagnosis not present

## 2016-12-19 DIAGNOSIS — F329 Major depressive disorder, single episode, unspecified: Secondary | ICD-10-CM | POA: Diagnosis not present

## 2016-12-19 DIAGNOSIS — E782 Mixed hyperlipidemia: Secondary | ICD-10-CM | POA: Diagnosis not present

## 2016-12-19 DIAGNOSIS — F419 Anxiety disorder, unspecified: Secondary | ICD-10-CM | POA: Diagnosis not present

## 2016-12-19 DIAGNOSIS — E1165 Type 2 diabetes mellitus with hyperglycemia: Secondary | ICD-10-CM | POA: Diagnosis not present

## 2016-12-20 DIAGNOSIS — H1045 Other chronic allergic conjunctivitis: Secondary | ICD-10-CM | POA: Diagnosis not present

## 2016-12-20 DIAGNOSIS — E119 Type 2 diabetes mellitus without complications: Secondary | ICD-10-CM | POA: Diagnosis not present

## 2016-12-20 DIAGNOSIS — Z7984 Long term (current) use of oral hypoglycemic drugs: Secondary | ICD-10-CM | POA: Diagnosis not present

## 2016-12-20 DIAGNOSIS — H26053 Posterior subcapsular polar infantile and juvenile cataract, bilateral: Secondary | ICD-10-CM | POA: Diagnosis not present

## 2016-12-20 DIAGNOSIS — Z0100 Encounter for examination of eyes and vision without abnormal findings: Secondary | ICD-10-CM | POA: Diagnosis not present

## 2017-01-05 DIAGNOSIS — N83202 Unspecified ovarian cyst, left side: Secondary | ICD-10-CM | POA: Diagnosis not present

## 2017-01-05 DIAGNOSIS — Z6841 Body Mass Index (BMI) 40.0 and over, adult: Secondary | ICD-10-CM | POA: Diagnosis not present

## 2017-01-05 DIAGNOSIS — N930 Postcoital and contact bleeding: Secondary | ICD-10-CM | POA: Diagnosis not present

## 2017-01-05 DIAGNOSIS — N926 Irregular menstruation, unspecified: Secondary | ICD-10-CM | POA: Diagnosis not present

## 2017-01-31 DIAGNOSIS — N941 Unspecified dyspareunia: Secondary | ICD-10-CM | POA: Diagnosis not present

## 2017-01-31 DIAGNOSIS — N83202 Unspecified ovarian cyst, left side: Secondary | ICD-10-CM | POA: Diagnosis not present

## 2017-01-31 DIAGNOSIS — E039 Hypothyroidism, unspecified: Secondary | ICD-10-CM | POA: Diagnosis not present

## 2017-01-31 DIAGNOSIS — E282 Polycystic ovarian syndrome: Secondary | ICD-10-CM | POA: Diagnosis not present

## 2017-01-31 DIAGNOSIS — E7800 Pure hypercholesterolemia, unspecified: Secondary | ICD-10-CM | POA: Diagnosis not present

## 2017-01-31 DIAGNOSIS — I10 Essential (primary) hypertension: Secondary | ICD-10-CM | POA: Diagnosis not present

## 2017-01-31 DIAGNOSIS — E8881 Metabolic syndrome: Secondary | ICD-10-CM | POA: Diagnosis not present

## 2017-01-31 DIAGNOSIS — N946 Dysmenorrhea, unspecified: Secondary | ICD-10-CM | POA: Diagnosis not present

## 2017-01-31 DIAGNOSIS — F329 Major depressive disorder, single episode, unspecified: Secondary | ICD-10-CM | POA: Diagnosis not present

## 2017-02-01 DIAGNOSIS — E7800 Pure hypercholesterolemia, unspecified: Secondary | ICD-10-CM | POA: Diagnosis not present

## 2017-02-01 DIAGNOSIS — I10 Essential (primary) hypertension: Secondary | ICD-10-CM | POA: Diagnosis not present

## 2017-02-01 DIAGNOSIS — N946 Dysmenorrhea, unspecified: Secondary | ICD-10-CM | POA: Diagnosis not present

## 2017-02-01 DIAGNOSIS — E8881 Metabolic syndrome: Secondary | ICD-10-CM | POA: Diagnosis not present

## 2017-02-01 DIAGNOSIS — N83201 Unspecified ovarian cyst, right side: Secondary | ICD-10-CM | POA: Diagnosis not present

## 2017-02-01 DIAGNOSIS — N941 Unspecified dyspareunia: Secondary | ICD-10-CM | POA: Diagnosis not present

## 2017-02-01 DIAGNOSIS — N83202 Unspecified ovarian cyst, left side: Secondary | ICD-10-CM | POA: Diagnosis not present

## 2017-02-01 DIAGNOSIS — F329 Major depressive disorder, single episode, unspecified: Secondary | ICD-10-CM | POA: Diagnosis not present

## 2017-02-01 DIAGNOSIS — E039 Hypothyroidism, unspecified: Secondary | ICD-10-CM | POA: Diagnosis not present

## 2017-02-01 DIAGNOSIS — E282 Polycystic ovarian syndrome: Secondary | ICD-10-CM | POA: Diagnosis not present

## 2017-02-21 DIAGNOSIS — N926 Irregular menstruation, unspecified: Secondary | ICD-10-CM | POA: Diagnosis not present

## 2017-02-21 DIAGNOSIS — E039 Hypothyroidism, unspecified: Secondary | ICD-10-CM | POA: Diagnosis not present

## 2017-04-17 DIAGNOSIS — Z6841 Body Mass Index (BMI) 40.0 and over, adult: Secondary | ICD-10-CM | POA: Diagnosis not present

## 2017-04-17 DIAGNOSIS — E282 Polycystic ovarian syndrome: Secondary | ICD-10-CM | POA: Diagnosis not present

## 2017-04-17 DIAGNOSIS — N970 Female infertility associated with anovulation: Secondary | ICD-10-CM | POA: Diagnosis not present

## 2017-04-17 DIAGNOSIS — Z3169 Encounter for other general counseling and advice on procreation: Secondary | ICD-10-CM | POA: Diagnosis not present

## 2017-05-16 DIAGNOSIS — Z6841 Body Mass Index (BMI) 40.0 and over, adult: Secondary | ICD-10-CM | POA: Diagnosis not present

## 2017-05-16 DIAGNOSIS — E1165 Type 2 diabetes mellitus with hyperglycemia: Secondary | ICD-10-CM | POA: Diagnosis not present

## 2017-05-16 DIAGNOSIS — R002 Palpitations: Secondary | ICD-10-CM | POA: Diagnosis not present

## 2017-05-16 DIAGNOSIS — J4531 Mild persistent asthma with (acute) exacerbation: Secondary | ICD-10-CM | POA: Diagnosis not present

## 2017-05-16 DIAGNOSIS — R0600 Dyspnea, unspecified: Secondary | ICD-10-CM | POA: Diagnosis not present

## 2017-05-18 DIAGNOSIS — R0602 Shortness of breath: Secondary | ICD-10-CM | POA: Diagnosis not present

## 2017-05-18 DIAGNOSIS — K219 Gastro-esophageal reflux disease without esophagitis: Secondary | ICD-10-CM | POA: Diagnosis not present

## 2017-05-18 DIAGNOSIS — E039 Hypothyroidism, unspecified: Secondary | ICD-10-CM | POA: Diagnosis not present

## 2017-05-18 DIAGNOSIS — E119 Type 2 diabetes mellitus without complications: Secondary | ICD-10-CM | POA: Diagnosis not present

## 2017-05-18 DIAGNOSIS — J45909 Unspecified asthma, uncomplicated: Secondary | ICD-10-CM | POA: Diagnosis not present

## 2017-05-18 DIAGNOSIS — I10 Essential (primary) hypertension: Secondary | ICD-10-CM | POA: Diagnosis not present

## 2017-05-18 DIAGNOSIS — Z7984 Long term (current) use of oral hypoglycemic drugs: Secondary | ICD-10-CM | POA: Diagnosis not present

## 2017-05-18 DIAGNOSIS — E7800 Pure hypercholesterolemia, unspecified: Secondary | ICD-10-CM | POA: Diagnosis not present

## 2017-05-18 DIAGNOSIS — Z79899 Other long term (current) drug therapy: Secondary | ICD-10-CM | POA: Diagnosis not present

## 2017-05-18 DIAGNOSIS — J40 Bronchitis, not specified as acute or chronic: Secondary | ICD-10-CM | POA: Diagnosis not present

## 2017-05-18 DIAGNOSIS — R05 Cough: Secondary | ICD-10-CM | POA: Diagnosis not present

## 2017-05-22 DIAGNOSIS — E1165 Type 2 diabetes mellitus with hyperglycemia: Secondary | ICD-10-CM | POA: Diagnosis not present

## 2017-05-22 DIAGNOSIS — R062 Wheezing: Secondary | ICD-10-CM | POA: Diagnosis not present

## 2017-05-22 DIAGNOSIS — J4531 Mild persistent asthma with (acute) exacerbation: Secondary | ICD-10-CM | POA: Diagnosis not present

## 2017-05-22 DIAGNOSIS — Z6834 Body mass index (BMI) 34.0-34.9, adult: Secondary | ICD-10-CM | POA: Diagnosis not present

## 2017-05-29 DIAGNOSIS — M109 Gout, unspecified: Secondary | ICD-10-CM | POA: Diagnosis not present

## 2017-05-29 DIAGNOSIS — Z6839 Body mass index (BMI) 39.0-39.9, adult: Secondary | ICD-10-CM | POA: Diagnosis not present

## 2017-05-29 DIAGNOSIS — E1165 Type 2 diabetes mellitus with hyperglycemia: Secondary | ICD-10-CM | POA: Diagnosis not present

## 2017-05-29 DIAGNOSIS — E782 Mixed hyperlipidemia: Secondary | ICD-10-CM | POA: Diagnosis not present

## 2017-05-29 DIAGNOSIS — E039 Hypothyroidism, unspecified: Secondary | ICD-10-CM | POA: Diagnosis not present

## 2017-05-29 DIAGNOSIS — E669 Obesity, unspecified: Secondary | ICD-10-CM | POA: Diagnosis not present

## 2017-05-29 DIAGNOSIS — R5383 Other fatigue: Secondary | ICD-10-CM | POA: Diagnosis not present

## 2017-05-29 DIAGNOSIS — F419 Anxiety disorder, unspecified: Secondary | ICD-10-CM | POA: Diagnosis not present

## 2017-05-29 DIAGNOSIS — M545 Low back pain: Secondary | ICD-10-CM | POA: Diagnosis not present

## 2017-05-29 DIAGNOSIS — F329 Major depressive disorder, single episode, unspecified: Secondary | ICD-10-CM | POA: Diagnosis not present

## 2017-05-29 DIAGNOSIS — J4531 Mild persistent asthma with (acute) exacerbation: Secondary | ICD-10-CM | POA: Diagnosis not present

## 2017-05-29 DIAGNOSIS — I10 Essential (primary) hypertension: Secondary | ICD-10-CM | POA: Diagnosis not present

## 2017-06-08 DIAGNOSIS — N926 Irregular menstruation, unspecified: Secondary | ICD-10-CM | POA: Diagnosis not present

## 2017-06-08 DIAGNOSIS — Z6841 Body Mass Index (BMI) 40.0 and over, adult: Secondary | ICD-10-CM | POA: Diagnosis not present

## 2017-06-08 DIAGNOSIS — E282 Polycystic ovarian syndrome: Secondary | ICD-10-CM | POA: Diagnosis not present

## 2017-08-15 DIAGNOSIS — F419 Anxiety disorder, unspecified: Secondary | ICD-10-CM | POA: Diagnosis not present

## 2017-08-15 DIAGNOSIS — N189 Chronic kidney disease, unspecified: Secondary | ICD-10-CM | POA: Diagnosis not present

## 2017-08-15 DIAGNOSIS — M545 Low back pain: Secondary | ICD-10-CM | POA: Diagnosis not present

## 2017-08-15 DIAGNOSIS — E782 Mixed hyperlipidemia: Secondary | ICD-10-CM | POA: Diagnosis not present

## 2017-08-15 DIAGNOSIS — E669 Obesity, unspecified: Secondary | ICD-10-CM | POA: Diagnosis not present

## 2017-08-15 DIAGNOSIS — J4531 Mild persistent asthma with (acute) exacerbation: Secondary | ICD-10-CM | POA: Diagnosis not present

## 2017-08-15 DIAGNOSIS — I10 Essential (primary) hypertension: Secondary | ICD-10-CM | POA: Diagnosis not present

## 2017-08-15 DIAGNOSIS — M109 Gout, unspecified: Secondary | ICD-10-CM | POA: Diagnosis not present

## 2017-08-15 DIAGNOSIS — E1165 Type 2 diabetes mellitus with hyperglycemia: Secondary | ICD-10-CM | POA: Diagnosis not present

## 2017-08-15 DIAGNOSIS — E039 Hypothyroidism, unspecified: Secondary | ICD-10-CM | POA: Diagnosis not present

## 2017-08-15 DIAGNOSIS — F329 Major depressive disorder, single episode, unspecified: Secondary | ICD-10-CM | POA: Diagnosis not present

## 2017-09-18 DIAGNOSIS — E039 Hypothyroidism, unspecified: Secondary | ICD-10-CM | POA: Diagnosis not present

## 2017-09-18 DIAGNOSIS — F419 Anxiety disorder, unspecified: Secondary | ICD-10-CM | POA: Diagnosis not present

## 2017-09-18 DIAGNOSIS — M109 Gout, unspecified: Secondary | ICD-10-CM | POA: Diagnosis not present

## 2017-09-18 DIAGNOSIS — F329 Major depressive disorder, single episode, unspecified: Secondary | ICD-10-CM | POA: Diagnosis not present

## 2017-09-18 DIAGNOSIS — I10 Essential (primary) hypertension: Secondary | ICD-10-CM | POA: Diagnosis not present

## 2017-09-18 DIAGNOSIS — E1165 Type 2 diabetes mellitus with hyperglycemia: Secondary | ICD-10-CM | POA: Diagnosis not present

## 2017-09-18 DIAGNOSIS — M545 Low back pain: Secondary | ICD-10-CM | POA: Diagnosis not present

## 2017-09-18 DIAGNOSIS — E782 Mixed hyperlipidemia: Secondary | ICD-10-CM | POA: Diagnosis not present

## 2017-09-18 DIAGNOSIS — N644 Mastodynia: Secondary | ICD-10-CM | POA: Diagnosis not present

## 2017-09-18 DIAGNOSIS — Z23 Encounter for immunization: Secondary | ICD-10-CM | POA: Diagnosis not present

## 2017-09-18 DIAGNOSIS — E669 Obesity, unspecified: Secondary | ICD-10-CM | POA: Diagnosis not present

## 2017-10-31 DIAGNOSIS — R109 Unspecified abdominal pain: Secondary | ICD-10-CM | POA: Diagnosis not present

## 2017-10-31 DIAGNOSIS — N83202 Unspecified ovarian cyst, left side: Secondary | ICD-10-CM | POA: Diagnosis not present

## 2017-10-31 DIAGNOSIS — K769 Liver disease, unspecified: Secondary | ICD-10-CM | POA: Diagnosis not present

## 2017-10-31 DIAGNOSIS — Z6841 Body Mass Index (BMI) 40.0 and over, adult: Secondary | ICD-10-CM | POA: Diagnosis not present

## 2017-10-31 DIAGNOSIS — R319 Hematuria, unspecified: Secondary | ICD-10-CM | POA: Diagnosis not present

## 2017-10-31 DIAGNOSIS — N83201 Unspecified ovarian cyst, right side: Secondary | ICD-10-CM | POA: Diagnosis not present

## 2017-11-07 DIAGNOSIS — N912 Amenorrhea, unspecified: Secondary | ICD-10-CM | POA: Diagnosis not present

## 2017-11-07 DIAGNOSIS — E282 Polycystic ovarian syndrome: Secondary | ICD-10-CM | POA: Diagnosis not present

## 2017-11-07 DIAGNOSIS — R102 Pelvic and perineal pain: Secondary | ICD-10-CM | POA: Diagnosis not present

## 2017-12-18 DIAGNOSIS — E669 Obesity, unspecified: Secondary | ICD-10-CM | POA: Diagnosis not present

## 2017-12-18 DIAGNOSIS — F419 Anxiety disorder, unspecified: Secondary | ICD-10-CM | POA: Diagnosis not present

## 2017-12-18 DIAGNOSIS — M25531 Pain in right wrist: Secondary | ICD-10-CM | POA: Diagnosis not present

## 2017-12-18 DIAGNOSIS — M545 Low back pain: Secondary | ICD-10-CM | POA: Diagnosis not present

## 2017-12-18 DIAGNOSIS — E782 Mixed hyperlipidemia: Secondary | ICD-10-CM | POA: Diagnosis not present

## 2017-12-18 DIAGNOSIS — M109 Gout, unspecified: Secondary | ICD-10-CM | POA: Diagnosis not present

## 2017-12-18 DIAGNOSIS — E1165 Type 2 diabetes mellitus with hyperglycemia: Secondary | ICD-10-CM | POA: Diagnosis not present

## 2017-12-18 DIAGNOSIS — E039 Hypothyroidism, unspecified: Secondary | ICD-10-CM | POA: Diagnosis not present

## 2017-12-18 DIAGNOSIS — I10 Essential (primary) hypertension: Secondary | ICD-10-CM | POA: Diagnosis not present

## 2017-12-18 DIAGNOSIS — F329 Major depressive disorder, single episode, unspecified: Secondary | ICD-10-CM | POA: Diagnosis not present

## 2017-12-27 DIAGNOSIS — M25531 Pain in right wrist: Secondary | ICD-10-CM | POA: Diagnosis not present

## 2017-12-27 DIAGNOSIS — M654 Radial styloid tenosynovitis [de Quervain]: Secondary | ICD-10-CM | POA: Diagnosis not present

## 2017-12-27 DIAGNOSIS — E1165 Type 2 diabetes mellitus with hyperglycemia: Secondary | ICD-10-CM | POA: Diagnosis not present

## 2018-02-02 DIAGNOSIS — E1165 Type 2 diabetes mellitus with hyperglycemia: Secondary | ICD-10-CM | POA: Diagnosis not present

## 2018-02-02 DIAGNOSIS — M109 Gout, unspecified: Secondary | ICD-10-CM | POA: Diagnosis not present

## 2018-02-12 DIAGNOSIS — E109 Type 1 diabetes mellitus without complications: Secondary | ICD-10-CM | POA: Diagnosis not present

## 2018-02-12 DIAGNOSIS — H26053 Posterior subcapsular polar infantile and juvenile cataract, bilateral: Secondary | ICD-10-CM | POA: Diagnosis not present

## 2018-02-12 DIAGNOSIS — Z0100 Encounter for examination of eyes and vision without abnormal findings: Secondary | ICD-10-CM | POA: Diagnosis not present

## 2018-02-14 DIAGNOSIS — Z1389 Encounter for screening for other disorder: Secondary | ICD-10-CM | POA: Diagnosis not present

## 2018-02-14 DIAGNOSIS — E039 Hypothyroidism, unspecified: Secondary | ICD-10-CM | POA: Diagnosis not present

## 2018-02-14 DIAGNOSIS — M109 Gout, unspecified: Secondary | ICD-10-CM | POA: Diagnosis not present

## 2018-02-14 DIAGNOSIS — E1165 Type 2 diabetes mellitus with hyperglycemia: Secondary | ICD-10-CM | POA: Diagnosis not present

## 2018-02-14 DIAGNOSIS — J4520 Mild intermittent asthma, uncomplicated: Secondary | ICD-10-CM | POA: Diagnosis not present

## 2018-02-14 DIAGNOSIS — E782 Mixed hyperlipidemia: Secondary | ICD-10-CM | POA: Diagnosis not present

## 2018-02-14 DIAGNOSIS — I10 Essential (primary) hypertension: Secondary | ICD-10-CM | POA: Diagnosis not present

## 2018-02-21 DIAGNOSIS — E282 Polycystic ovarian syndrome: Secondary | ICD-10-CM | POA: Diagnosis not present

## 2018-02-21 DIAGNOSIS — R102 Pelvic and perineal pain: Secondary | ICD-10-CM | POA: Diagnosis not present

## 2018-02-21 DIAGNOSIS — N970 Female infertility associated with anovulation: Secondary | ICD-10-CM | POA: Diagnosis not present

## 2018-02-21 DIAGNOSIS — Z01419 Encounter for gynecological examination (general) (routine) without abnormal findings: Secondary | ICD-10-CM | POA: Diagnosis not present

## 2018-02-21 DIAGNOSIS — Z6841 Body Mass Index (BMI) 40.0 and over, adult: Secondary | ICD-10-CM | POA: Diagnosis not present

## 2018-04-09 DIAGNOSIS — M109 Gout, unspecified: Secondary | ICD-10-CM | POA: Diagnosis not present

## 2018-04-09 DIAGNOSIS — E1165 Type 2 diabetes mellitus with hyperglycemia: Secondary | ICD-10-CM | POA: Diagnosis not present

## 2018-04-09 DIAGNOSIS — Z6841 Body Mass Index (BMI) 40.0 and over, adult: Secondary | ICD-10-CM | POA: Diagnosis not present

## 2018-04-09 DIAGNOSIS — I10 Essential (primary) hypertension: Secondary | ICD-10-CM | POA: Diagnosis not present

## 2018-04-09 DIAGNOSIS — E782 Mixed hyperlipidemia: Secondary | ICD-10-CM | POA: Diagnosis not present

## 2018-04-09 DIAGNOSIS — E039 Hypothyroidism, unspecified: Secondary | ICD-10-CM | POA: Diagnosis not present

## 2018-04-09 DIAGNOSIS — J4520 Mild intermittent asthma, uncomplicated: Secondary | ICD-10-CM | POA: Diagnosis not present

## 2018-04-12 DIAGNOSIS — E282 Polycystic ovarian syndrome: Secondary | ICD-10-CM | POA: Diagnosis not present

## 2018-04-12 DIAGNOSIS — R102 Pelvic and perineal pain: Secondary | ICD-10-CM | POA: Diagnosis not present

## 2018-05-02 DIAGNOSIS — E1165 Type 2 diabetes mellitus with hyperglycemia: Secondary | ICD-10-CM | POA: Diagnosis not present

## 2018-05-02 DIAGNOSIS — Z6841 Body Mass Index (BMI) 40.0 and over, adult: Secondary | ICD-10-CM | POA: Diagnosis not present

## 2018-05-02 DIAGNOSIS — M654 Radial styloid tenosynovitis [de Quervain]: Secondary | ICD-10-CM | POA: Diagnosis not present

## 2018-05-02 DIAGNOSIS — E039 Hypothyroidism, unspecified: Secondary | ICD-10-CM | POA: Diagnosis not present

## 2018-05-02 DIAGNOSIS — E1122 Type 2 diabetes mellitus with diabetic chronic kidney disease: Secondary | ICD-10-CM | POA: Diagnosis not present

## 2018-05-02 DIAGNOSIS — N183 Chronic kidney disease, stage 3 (moderate): Secondary | ICD-10-CM | POA: Diagnosis not present

## 2018-05-13 DIAGNOSIS — F419 Anxiety disorder, unspecified: Secondary | ICD-10-CM | POA: Diagnosis not present

## 2018-05-13 DIAGNOSIS — K219 Gastro-esophageal reflux disease without esophagitis: Secondary | ICD-10-CM | POA: Diagnosis not present

## 2018-05-13 DIAGNOSIS — E039 Hypothyroidism, unspecified: Secondary | ICD-10-CM | POA: Diagnosis not present

## 2018-05-13 DIAGNOSIS — I10 Essential (primary) hypertension: Secondary | ICD-10-CM | POA: Diagnosis not present

## 2018-05-13 DIAGNOSIS — Z7984 Long term (current) use of oral hypoglycemic drugs: Secondary | ICD-10-CM | POA: Diagnosis not present

## 2018-05-13 DIAGNOSIS — E119 Type 2 diabetes mellitus without complications: Secondary | ICD-10-CM | POA: Diagnosis not present

## 2018-05-13 DIAGNOSIS — M109 Gout, unspecified: Secondary | ICD-10-CM | POA: Diagnosis not present

## 2018-05-13 DIAGNOSIS — Z79899 Other long term (current) drug therapy: Secondary | ICD-10-CM | POA: Diagnosis not present

## 2018-06-28 DIAGNOSIS — F329 Major depressive disorder, single episode, unspecified: Secondary | ICD-10-CM | POA: Diagnosis not present

## 2018-06-28 DIAGNOSIS — J4520 Mild intermittent asthma, uncomplicated: Secondary | ICD-10-CM | POA: Diagnosis not present

## 2018-06-28 DIAGNOSIS — I10 Essential (primary) hypertension: Secondary | ICD-10-CM | POA: Diagnosis not present

## 2018-06-28 DIAGNOSIS — M545 Low back pain: Secondary | ICD-10-CM | POA: Diagnosis not present

## 2018-06-28 DIAGNOSIS — M109 Gout, unspecified: Secondary | ICD-10-CM | POA: Diagnosis not present

## 2018-06-28 DIAGNOSIS — E782 Mixed hyperlipidemia: Secondary | ICD-10-CM | POA: Diagnosis not present

## 2018-06-28 DIAGNOSIS — N189 Chronic kidney disease, unspecified: Secondary | ICD-10-CM | POA: Diagnosis not present

## 2018-06-28 DIAGNOSIS — H608X2 Other otitis externa, left ear: Secondary | ICD-10-CM | POA: Diagnosis not present

## 2018-06-28 DIAGNOSIS — E039 Hypothyroidism, unspecified: Secondary | ICD-10-CM | POA: Diagnosis not present

## 2018-06-28 DIAGNOSIS — E669 Obesity, unspecified: Secondary | ICD-10-CM | POA: Diagnosis not present

## 2018-06-28 DIAGNOSIS — F419 Anxiety disorder, unspecified: Secondary | ICD-10-CM | POA: Diagnosis not present

## 2018-06-28 DIAGNOSIS — Z6841 Body Mass Index (BMI) 40.0 and over, adult: Secondary | ICD-10-CM | POA: Diagnosis not present

## 2018-06-28 DIAGNOSIS — E1165 Type 2 diabetes mellitus with hyperglycemia: Secondary | ICD-10-CM | POA: Diagnosis not present

## 2018-08-09 DIAGNOSIS — E282 Polycystic ovarian syndrome: Secondary | ICD-10-CM | POA: Diagnosis not present

## 2018-08-09 DIAGNOSIS — R102 Pelvic and perineal pain: Secondary | ICD-10-CM | POA: Diagnosis not present

## 2018-08-09 DIAGNOSIS — R319 Hematuria, unspecified: Secondary | ICD-10-CM | POA: Diagnosis not present

## 2018-08-18 DIAGNOSIS — M109 Gout, unspecified: Secondary | ICD-10-CM | POA: Diagnosis not present

## 2018-08-18 DIAGNOSIS — E7800 Pure hypercholesterolemia, unspecified: Secondary | ICD-10-CM | POA: Diagnosis not present

## 2018-08-18 DIAGNOSIS — R110 Nausea: Secondary | ICD-10-CM | POA: Diagnosis not present

## 2018-08-18 DIAGNOSIS — Z7984 Long term (current) use of oral hypoglycemic drugs: Secondary | ICD-10-CM | POA: Diagnosis not present

## 2018-08-18 DIAGNOSIS — I10 Essential (primary) hypertension: Secondary | ICD-10-CM | POA: Diagnosis not present

## 2018-08-18 DIAGNOSIS — J45909 Unspecified asthma, uncomplicated: Secondary | ICD-10-CM | POA: Diagnosis not present

## 2018-08-18 DIAGNOSIS — K5900 Constipation, unspecified: Secondary | ICD-10-CM | POA: Diagnosis not present

## 2018-08-18 DIAGNOSIS — Z79899 Other long term (current) drug therapy: Secondary | ICD-10-CM | POA: Diagnosis not present

## 2018-08-18 DIAGNOSIS — R1030 Lower abdominal pain, unspecified: Secondary | ICD-10-CM | POA: Diagnosis not present

## 2018-09-02 DIAGNOSIS — R509 Fever, unspecified: Secondary | ICD-10-CM | POA: Diagnosis not present

## 2018-09-02 DIAGNOSIS — E119 Type 2 diabetes mellitus without complications: Secondary | ICD-10-CM | POA: Diagnosis not present

## 2018-09-02 DIAGNOSIS — R5081 Fever presenting with conditions classified elsewhere: Secondary | ICD-10-CM | POA: Diagnosis not present

## 2018-09-02 DIAGNOSIS — R0602 Shortness of breath: Secondary | ICD-10-CM | POA: Diagnosis not present

## 2018-09-02 DIAGNOSIS — J45901 Unspecified asthma with (acute) exacerbation: Secondary | ICD-10-CM | POA: Diagnosis not present

## 2018-09-11 DIAGNOSIS — Z6841 Body Mass Index (BMI) 40.0 and over, adult: Secondary | ICD-10-CM | POA: Diagnosis not present

## 2018-09-11 DIAGNOSIS — N926 Irregular menstruation, unspecified: Secondary | ICD-10-CM | POA: Diagnosis not present

## 2018-09-11 DIAGNOSIS — E039 Hypothyroidism, unspecified: Secondary | ICD-10-CM | POA: Diagnosis not present

## 2018-09-11 DIAGNOSIS — Z720 Tobacco use: Secondary | ICD-10-CM | POA: Diagnosis not present

## 2018-09-11 DIAGNOSIS — N183 Chronic kidney disease, stage 3 (moderate): Secondary | ICD-10-CM | POA: Diagnosis not present

## 2018-10-16 DIAGNOSIS — E039 Hypothyroidism, unspecified: Secondary | ICD-10-CM | POA: Diagnosis not present

## 2018-10-16 DIAGNOSIS — F329 Major depressive disorder, single episode, unspecified: Secondary | ICD-10-CM | POA: Diagnosis not present

## 2018-10-16 DIAGNOSIS — E782 Mixed hyperlipidemia: Secondary | ICD-10-CM | POA: Diagnosis not present

## 2018-10-16 DIAGNOSIS — N926 Irregular menstruation, unspecified: Secondary | ICD-10-CM | POA: Diagnosis not present

## 2018-10-16 DIAGNOSIS — E1165 Type 2 diabetes mellitus with hyperglycemia: Secondary | ICD-10-CM | POA: Diagnosis not present

## 2018-10-16 DIAGNOSIS — I10 Essential (primary) hypertension: Secondary | ICD-10-CM | POA: Diagnosis not present

## 2018-10-16 DIAGNOSIS — Z23 Encounter for immunization: Secondary | ICD-10-CM | POA: Diagnosis not present

## 2018-10-16 DIAGNOSIS — N189 Chronic kidney disease, unspecified: Secondary | ICD-10-CM | POA: Diagnosis not present

## 2018-10-16 DIAGNOSIS — M545 Low back pain: Secondary | ICD-10-CM | POA: Diagnosis not present

## 2018-10-16 DIAGNOSIS — F419 Anxiety disorder, unspecified: Secondary | ICD-10-CM | POA: Diagnosis not present

## 2018-10-16 DIAGNOSIS — M109 Gout, unspecified: Secondary | ICD-10-CM | POA: Diagnosis not present

## 2018-11-07 DIAGNOSIS — R05 Cough: Secondary | ICD-10-CM | POA: Diagnosis not present

## 2018-11-07 DIAGNOSIS — J4531 Mild persistent asthma with (acute) exacerbation: Secondary | ICD-10-CM | POA: Diagnosis not present

## 2018-12-17 DIAGNOSIS — J45901 Unspecified asthma with (acute) exacerbation: Secondary | ICD-10-CM | POA: Diagnosis not present

## 2018-12-17 DIAGNOSIS — B370 Candidal stomatitis: Secondary | ICD-10-CM | POA: Diagnosis not present

## 2018-12-17 DIAGNOSIS — L0390 Cellulitis, unspecified: Secondary | ICD-10-CM | POA: Diagnosis not present

## 2019-01-02 DIAGNOSIS — M2540 Effusion, unspecified joint: Secondary | ICD-10-CM | POA: Diagnosis not present

## 2019-01-02 DIAGNOSIS — M25561 Pain in right knee: Secondary | ICD-10-CM | POA: Diagnosis not present

## 2019-01-02 DIAGNOSIS — M109 Gout, unspecified: Secondary | ICD-10-CM | POA: Diagnosis not present

## 2019-01-08 DIAGNOSIS — Z720 Tobacco use: Secondary | ICD-10-CM | POA: Diagnosis not present

## 2019-01-08 DIAGNOSIS — M25561 Pain in right knee: Secondary | ICD-10-CM | POA: Diagnosis not present

## 2019-01-20 DIAGNOSIS — K219 Gastro-esophageal reflux disease without esophagitis: Secondary | ICD-10-CM | POA: Diagnosis not present

## 2019-01-20 DIAGNOSIS — M2391 Unspecified internal derangement of right knee: Secondary | ICD-10-CM | POA: Diagnosis not present

## 2019-01-20 DIAGNOSIS — E119 Type 2 diabetes mellitus without complications: Secondary | ICD-10-CM | POA: Diagnosis not present

## 2019-01-20 DIAGNOSIS — E039 Hypothyroidism, unspecified: Secondary | ICD-10-CM | POA: Diagnosis not present

## 2019-01-20 DIAGNOSIS — F17200 Nicotine dependence, unspecified, uncomplicated: Secondary | ICD-10-CM | POA: Diagnosis not present

## 2019-01-20 DIAGNOSIS — M25461 Effusion, right knee: Secondary | ICD-10-CM | POA: Diagnosis not present

## 2019-01-20 DIAGNOSIS — I10 Essential (primary) hypertension: Secondary | ICD-10-CM | POA: Diagnosis not present

## 2019-01-20 DIAGNOSIS — E7800 Pure hypercholesterolemia, unspecified: Secondary | ICD-10-CM | POA: Diagnosis not present

## 2019-01-20 DIAGNOSIS — J45909 Unspecified asthma, uncomplicated: Secondary | ICD-10-CM | POA: Diagnosis not present

## 2019-01-20 DIAGNOSIS — Z79899 Other long term (current) drug therapy: Secondary | ICD-10-CM | POA: Diagnosis not present

## 2019-01-29 DIAGNOSIS — F17200 Nicotine dependence, unspecified, uncomplicated: Secondary | ICD-10-CM | POA: Diagnosis not present

## 2019-01-29 DIAGNOSIS — J45909 Unspecified asthma, uncomplicated: Secondary | ICD-10-CM | POA: Diagnosis not present

## 2019-01-29 DIAGNOSIS — E7800 Pure hypercholesterolemia, unspecified: Secondary | ICD-10-CM | POA: Diagnosis not present

## 2019-01-29 DIAGNOSIS — R609 Edema, unspecified: Secondary | ICD-10-CM | POA: Diagnosis not present

## 2019-01-29 DIAGNOSIS — M109 Gout, unspecified: Secondary | ICD-10-CM | POA: Diagnosis not present

## 2019-01-29 DIAGNOSIS — E039 Hypothyroidism, unspecified: Secondary | ICD-10-CM | POA: Diagnosis not present

## 2019-01-29 DIAGNOSIS — K219 Gastro-esophageal reflux disease without esophagitis: Secondary | ICD-10-CM | POA: Diagnosis not present

## 2019-01-29 DIAGNOSIS — Z8639 Personal history of other endocrine, nutritional and metabolic disease: Secondary | ICD-10-CM | POA: Diagnosis not present

## 2019-01-29 DIAGNOSIS — M25461 Effusion, right knee: Secondary | ICD-10-CM | POA: Diagnosis not present

## 2019-01-29 DIAGNOSIS — I10 Essential (primary) hypertension: Secondary | ICD-10-CM | POA: Diagnosis not present

## 2019-01-29 DIAGNOSIS — R52 Pain, unspecified: Secondary | ICD-10-CM | POA: Diagnosis not present

## 2019-01-29 DIAGNOSIS — E119 Type 2 diabetes mellitus without complications: Secondary | ICD-10-CM | POA: Diagnosis not present

## 2019-02-13 DIAGNOSIS — M659 Synovitis and tenosynovitis, unspecified: Secondary | ICD-10-CM | POA: Diagnosis not present

## 2019-02-13 DIAGNOSIS — M25461 Effusion, right knee: Secondary | ICD-10-CM | POA: Diagnosis not present

## 2019-02-13 DIAGNOSIS — M25561 Pain in right knee: Secondary | ICD-10-CM | POA: Diagnosis not present

## 2019-02-14 DIAGNOSIS — R5383 Other fatigue: Secondary | ICD-10-CM | POA: Diagnosis not present

## 2019-02-14 DIAGNOSIS — Z6841 Body Mass Index (BMI) 40.0 and over, adult: Secondary | ICD-10-CM | POA: Diagnosis not present

## 2019-02-14 DIAGNOSIS — M25561 Pain in right knee: Secondary | ICD-10-CM | POA: Diagnosis not present

## 2019-02-14 DIAGNOSIS — E039 Hypothyroidism, unspecified: Secondary | ICD-10-CM | POA: Diagnosis not present

## 2019-02-27 ENCOUNTER — Ambulatory Visit (INDEPENDENT_AMBULATORY_CARE_PROVIDER_SITE_OTHER): Payer: Medicare HMO | Admitting: Orthopaedic Surgery

## 2019-02-27 ENCOUNTER — Other Ambulatory Visit: Payer: Self-pay

## 2019-05-17 DIAGNOSIS — M25571 Pain in right ankle and joints of right foot: Secondary | ICD-10-CM | POA: Diagnosis not present

## 2019-05-17 DIAGNOSIS — Z6841 Body Mass Index (BMI) 40.0 and over, adult: Secondary | ICD-10-CM | POA: Diagnosis not present

## 2019-05-17 DIAGNOSIS — E039 Hypothyroidism, unspecified: Secondary | ICD-10-CM | POA: Diagnosis not present

## 2019-05-17 DIAGNOSIS — E1165 Type 2 diabetes mellitus with hyperglycemia: Secondary | ICD-10-CM | POA: Diagnosis not present

## 2019-05-17 DIAGNOSIS — M25561 Pain in right knee: Secondary | ICD-10-CM | POA: Diagnosis not present

## 2019-05-17 DIAGNOSIS — M25572 Pain in left ankle and joints of left foot: Secondary | ICD-10-CM | POA: Diagnosis not present

## 2019-05-17 DIAGNOSIS — M109 Gout, unspecified: Secondary | ICD-10-CM | POA: Diagnosis not present

## 2019-05-23 DIAGNOSIS — H5213 Myopia, bilateral: Secondary | ICD-10-CM | POA: Diagnosis not present

## 2019-05-28 DIAGNOSIS — E1122 Type 2 diabetes mellitus with diabetic chronic kidney disease: Secondary | ICD-10-CM | POA: Diagnosis not present

## 2019-05-28 DIAGNOSIS — M654 Radial styloid tenosynovitis [de Quervain]: Secondary | ICD-10-CM | POA: Diagnosis not present

## 2019-05-28 DIAGNOSIS — Z6841 Body Mass Index (BMI) 40.0 and over, adult: Secondary | ICD-10-CM | POA: Diagnosis not present

## 2019-05-28 DIAGNOSIS — E039 Hypothyroidism, unspecified: Secondary | ICD-10-CM | POA: Diagnosis not present

## 2019-05-28 DIAGNOSIS — N183 Chronic kidney disease, stage 3 (moderate): Secondary | ICD-10-CM | POA: Diagnosis not present

## 2019-06-11 DIAGNOSIS — F331 Major depressive disorder, recurrent, moderate: Secondary | ICD-10-CM | POA: Diagnosis not present

## 2019-06-11 DIAGNOSIS — E1165 Type 2 diabetes mellitus with hyperglycemia: Secondary | ICD-10-CM | POA: Diagnosis not present

## 2019-06-24 DIAGNOSIS — I10 Essential (primary) hypertension: Secondary | ICD-10-CM | POA: Diagnosis not present

## 2019-06-24 DIAGNOSIS — Z7984 Long term (current) use of oral hypoglycemic drugs: Secondary | ICD-10-CM | POA: Diagnosis not present

## 2019-06-24 DIAGNOSIS — N3001 Acute cystitis with hematuria: Secondary | ICD-10-CM | POA: Diagnosis not present

## 2019-06-24 DIAGNOSIS — M25552 Pain in left hip: Secondary | ICD-10-CM | POA: Diagnosis not present

## 2019-06-24 DIAGNOSIS — M87052 Idiopathic aseptic necrosis of left femur: Secondary | ICD-10-CM | POA: Diagnosis not present

## 2019-06-24 DIAGNOSIS — E7800 Pure hypercholesterolemia, unspecified: Secondary | ICD-10-CM | POA: Diagnosis not present

## 2019-06-24 DIAGNOSIS — J45909 Unspecified asthma, uncomplicated: Secondary | ICD-10-CM | POA: Diagnosis not present

## 2019-06-24 DIAGNOSIS — F17200 Nicotine dependence, unspecified, uncomplicated: Secondary | ICD-10-CM | POA: Diagnosis not present

## 2019-06-24 DIAGNOSIS — E039 Hypothyroidism, unspecified: Secondary | ICD-10-CM | POA: Diagnosis not present

## 2019-06-24 DIAGNOSIS — E119 Type 2 diabetes mellitus without complications: Secondary | ICD-10-CM | POA: Diagnosis not present

## 2019-07-08 DIAGNOSIS — F329 Major depressive disorder, single episode, unspecified: Secondary | ICD-10-CM | POA: Diagnosis not present

## 2019-07-08 DIAGNOSIS — M5430 Sciatica, unspecified side: Secondary | ICD-10-CM | POA: Insufficient documentation

## 2019-07-12 DIAGNOSIS — I10 Essential (primary) hypertension: Secondary | ICD-10-CM | POA: Diagnosis not present

## 2019-07-12 DIAGNOSIS — E785 Hyperlipidemia, unspecified: Secondary | ICD-10-CM | POA: Diagnosis not present

## 2019-08-12 DIAGNOSIS — I10 Essential (primary) hypertension: Secondary | ICD-10-CM | POA: Diagnosis not present

## 2019-08-12 DIAGNOSIS — E785 Hyperlipidemia, unspecified: Secondary | ICD-10-CM | POA: Diagnosis not present

## 2019-08-21 DIAGNOSIS — N183 Chronic kidney disease, stage 3 (moderate): Secondary | ICD-10-CM | POA: Diagnosis not present

## 2019-08-21 DIAGNOSIS — E1165 Type 2 diabetes mellitus with hyperglycemia: Secondary | ICD-10-CM | POA: Diagnosis not present

## 2019-08-21 DIAGNOSIS — E039 Hypothyroidism, unspecified: Secondary | ICD-10-CM | POA: Diagnosis not present

## 2019-08-21 DIAGNOSIS — N189 Chronic kidney disease, unspecified: Secondary | ICD-10-CM | POA: Diagnosis not present

## 2019-08-21 DIAGNOSIS — N926 Irregular menstruation, unspecified: Secondary | ICD-10-CM | POA: Diagnosis not present

## 2019-08-21 DIAGNOSIS — Z6841 Body Mass Index (BMI) 40.0 and over, adult: Secondary | ICD-10-CM | POA: Diagnosis not present

## 2019-08-21 DIAGNOSIS — F419 Anxiety disorder, unspecified: Secondary | ICD-10-CM | POA: Diagnosis not present

## 2019-08-21 DIAGNOSIS — M545 Low back pain: Secondary | ICD-10-CM | POA: Diagnosis not present

## 2019-08-21 DIAGNOSIS — J4520 Mild intermittent asthma, uncomplicated: Secondary | ICD-10-CM | POA: Diagnosis not present

## 2019-08-21 DIAGNOSIS — F329 Major depressive disorder, single episode, unspecified: Secondary | ICD-10-CM | POA: Diagnosis not present

## 2019-08-21 DIAGNOSIS — M109 Gout, unspecified: Secondary | ICD-10-CM | POA: Diagnosis not present

## 2019-08-21 DIAGNOSIS — I10 Essential (primary) hypertension: Secondary | ICD-10-CM | POA: Diagnosis not present

## 2019-08-21 DIAGNOSIS — E782 Mixed hyperlipidemia: Secondary | ICD-10-CM | POA: Diagnosis not present

## 2019-08-24 DIAGNOSIS — W19XXXA Unspecified fall, initial encounter: Secondary | ICD-10-CM | POA: Diagnosis not present

## 2019-08-24 DIAGNOSIS — E7800 Pure hypercholesterolemia, unspecified: Secondary | ICD-10-CM | POA: Diagnosis not present

## 2019-08-24 DIAGNOSIS — I10 Essential (primary) hypertension: Secondary | ICD-10-CM | POA: Diagnosis not present

## 2019-08-24 DIAGNOSIS — M25561 Pain in right knee: Secondary | ICD-10-CM | POA: Diagnosis not present

## 2019-08-24 DIAGNOSIS — Z882 Allergy status to sulfonamides status: Secondary | ICD-10-CM | POA: Diagnosis not present

## 2019-08-24 DIAGNOSIS — M109 Gout, unspecified: Secondary | ICD-10-CM | POA: Diagnosis not present

## 2019-08-24 DIAGNOSIS — E039 Hypothyroidism, unspecified: Secondary | ICD-10-CM | POA: Diagnosis not present

## 2019-08-24 DIAGNOSIS — Z79899 Other long term (current) drug therapy: Secondary | ICD-10-CM | POA: Diagnosis not present

## 2019-08-24 DIAGNOSIS — E119 Type 2 diabetes mellitus without complications: Secondary | ICD-10-CM | POA: Diagnosis not present

## 2019-09-24 DIAGNOSIS — Z23 Encounter for immunization: Secondary | ICD-10-CM | POA: Diagnosis not present

## 2019-09-24 DIAGNOSIS — M25552 Pain in left hip: Secondary | ICD-10-CM | POA: Diagnosis not present

## 2019-10-11 DIAGNOSIS — I10 Essential (primary) hypertension: Secondary | ICD-10-CM | POA: Diagnosis not present

## 2019-10-11 DIAGNOSIS — E782 Mixed hyperlipidemia: Secondary | ICD-10-CM | POA: Diagnosis not present

## 2019-10-18 DIAGNOSIS — M25579 Pain in unspecified ankle and joints of unspecified foot: Secondary | ICD-10-CM | POA: Diagnosis not present

## 2019-10-18 DIAGNOSIS — M109 Gout, unspecified: Secondary | ICD-10-CM | POA: Diagnosis not present

## 2019-10-21 DIAGNOSIS — N183 Chronic kidney disease, stage 3 unspecified: Secondary | ICD-10-CM | POA: Diagnosis not present

## 2019-10-21 DIAGNOSIS — M109 Gout, unspecified: Secondary | ICD-10-CM | POA: Diagnosis not present

## 2019-11-11 DIAGNOSIS — I10 Essential (primary) hypertension: Secondary | ICD-10-CM | POA: Diagnosis not present

## 2019-11-11 DIAGNOSIS — E872 Acidosis: Secondary | ICD-10-CM | POA: Diagnosis not present

## 2019-11-13 DIAGNOSIS — J019 Acute sinusitis, unspecified: Secondary | ICD-10-CM | POA: Diagnosis not present

## 2020-01-10 DIAGNOSIS — M109 Gout, unspecified: Secondary | ICD-10-CM | POA: Diagnosis not present

## 2020-01-10 DIAGNOSIS — N183 Chronic kidney disease, stage 3 unspecified: Secondary | ICD-10-CM | POA: Diagnosis not present

## 2020-01-16 DIAGNOSIS — N926 Irregular menstruation, unspecified: Secondary | ICD-10-CM | POA: Diagnosis not present

## 2020-01-16 DIAGNOSIS — E1165 Type 2 diabetes mellitus with hyperglycemia: Secondary | ICD-10-CM | POA: Diagnosis not present

## 2020-01-16 DIAGNOSIS — Z720 Tobacco use: Secondary | ICD-10-CM | POA: Diagnosis not present

## 2020-01-16 DIAGNOSIS — M654 Radial styloid tenosynovitis [de Quervain]: Secondary | ICD-10-CM | POA: Diagnosis not present

## 2020-01-16 DIAGNOSIS — I10 Essential (primary) hypertension: Secondary | ICD-10-CM | POA: Diagnosis not present

## 2020-01-16 DIAGNOSIS — J4520 Mild intermittent asthma, uncomplicated: Secondary | ICD-10-CM | POA: Diagnosis not present

## 2020-03-02 DIAGNOSIS — R5383 Other fatigue: Secondary | ICD-10-CM | POA: Diagnosis not present

## 2020-03-02 DIAGNOSIS — M109 Gout, unspecified: Secondary | ICD-10-CM | POA: Diagnosis not present

## 2020-03-02 DIAGNOSIS — Z6841 Body Mass Index (BMI) 40.0 and over, adult: Secondary | ICD-10-CM | POA: Diagnosis not present

## 2020-03-07 DIAGNOSIS — E119 Type 2 diabetes mellitus without complications: Secondary | ICD-10-CM | POA: Diagnosis not present

## 2020-03-07 DIAGNOSIS — F17200 Nicotine dependence, unspecified, uncomplicated: Secondary | ICD-10-CM | POA: Diagnosis not present

## 2020-03-07 DIAGNOSIS — M5412 Radiculopathy, cervical region: Secondary | ICD-10-CM | POA: Diagnosis not present

## 2020-03-07 DIAGNOSIS — F419 Anxiety disorder, unspecified: Secondary | ICD-10-CM | POA: Diagnosis not present

## 2020-03-07 DIAGNOSIS — Z8639 Personal history of other endocrine, nutritional and metabolic disease: Secondary | ICD-10-CM | POA: Diagnosis not present

## 2020-03-07 DIAGNOSIS — E7800 Pure hypercholesterolemia, unspecified: Secondary | ICD-10-CM | POA: Diagnosis not present

## 2020-03-07 DIAGNOSIS — J45909 Unspecified asthma, uncomplicated: Secondary | ICD-10-CM | POA: Diagnosis not present

## 2020-03-07 DIAGNOSIS — I10 Essential (primary) hypertension: Secondary | ICD-10-CM | POA: Diagnosis not present

## 2020-03-07 DIAGNOSIS — E039 Hypothyroidism, unspecified: Secondary | ICD-10-CM | POA: Diagnosis not present

## 2020-04-03 DIAGNOSIS — N183 Chronic kidney disease, stage 3 unspecified: Secondary | ICD-10-CM | POA: Diagnosis not present

## 2020-04-03 DIAGNOSIS — M109 Gout, unspecified: Secondary | ICD-10-CM | POA: Diagnosis not present

## 2020-04-15 DIAGNOSIS — M109 Gout, unspecified: Secondary | ICD-10-CM | POA: Diagnosis not present

## 2020-04-15 DIAGNOSIS — N183 Chronic kidney disease, stage 3 unspecified: Secondary | ICD-10-CM | POA: Diagnosis not present

## 2020-04-28 DIAGNOSIS — Z6837 Body mass index (BMI) 37.0-37.9, adult: Secondary | ICD-10-CM | POA: Diagnosis not present

## 2020-04-28 DIAGNOSIS — R1110 Vomiting, unspecified: Secondary | ICD-10-CM | POA: Diagnosis not present

## 2020-04-28 DIAGNOSIS — M109 Gout, unspecified: Secondary | ICD-10-CM | POA: Diagnosis not present

## 2020-04-28 DIAGNOSIS — E860 Dehydration: Secondary | ICD-10-CM | POA: Diagnosis not present

## 2020-05-07 DIAGNOSIS — N183 Chronic kidney disease, stage 3 unspecified: Secondary | ICD-10-CM | POA: Diagnosis not present

## 2020-05-07 DIAGNOSIS — E1122 Type 2 diabetes mellitus with diabetic chronic kidney disease: Secondary | ICD-10-CM | POA: Diagnosis not present

## 2020-05-07 DIAGNOSIS — M109 Gout, unspecified: Secondary | ICD-10-CM | POA: Diagnosis not present

## 2020-05-16 DIAGNOSIS — E119 Type 2 diabetes mellitus without complications: Secondary | ICD-10-CM | POA: Diagnosis not present

## 2020-05-16 DIAGNOSIS — E669 Obesity, unspecified: Secondary | ICD-10-CM | POA: Diagnosis not present

## 2020-05-16 DIAGNOSIS — I10 Essential (primary) hypertension: Secondary | ICD-10-CM | POA: Diagnosis not present

## 2020-05-16 DIAGNOSIS — E7800 Pure hypercholesterolemia, unspecified: Secondary | ICD-10-CM | POA: Diagnosis not present

## 2020-05-16 DIAGNOSIS — R21 Rash and other nonspecific skin eruption: Secondary | ICD-10-CM | POA: Diagnosis not present

## 2020-05-31 DIAGNOSIS — E7800 Pure hypercholesterolemia, unspecified: Secondary | ICD-10-CM | POA: Diagnosis not present

## 2020-05-31 DIAGNOSIS — N764 Abscess of vulva: Secondary | ICD-10-CM | POA: Diagnosis not present

## 2020-05-31 DIAGNOSIS — E119 Type 2 diabetes mellitus without complications: Secondary | ICD-10-CM | POA: Diagnosis not present

## 2020-05-31 DIAGNOSIS — E669 Obesity, unspecified: Secondary | ICD-10-CM | POA: Diagnosis not present

## 2020-05-31 DIAGNOSIS — I10 Essential (primary) hypertension: Secondary | ICD-10-CM | POA: Diagnosis not present

## 2020-06-02 DIAGNOSIS — R079 Chest pain, unspecified: Secondary | ICD-10-CM | POA: Diagnosis not present

## 2020-06-02 DIAGNOSIS — M25511 Pain in right shoulder: Secondary | ICD-10-CM | POA: Diagnosis not present

## 2020-06-02 DIAGNOSIS — N764 Abscess of vulva: Secondary | ICD-10-CM | POA: Diagnosis not present

## 2020-06-02 DIAGNOSIS — Z1331 Encounter for screening for depression: Secondary | ICD-10-CM | POA: Diagnosis not present

## 2020-06-02 DIAGNOSIS — Z1389 Encounter for screening for other disorder: Secondary | ICD-10-CM | POA: Diagnosis not present

## 2020-06-06 DIAGNOSIS — E669 Obesity, unspecified: Secondary | ICD-10-CM | POA: Diagnosis not present

## 2020-06-06 DIAGNOSIS — M79601 Pain in right arm: Secondary | ICD-10-CM | POA: Diagnosis not present

## 2020-06-06 DIAGNOSIS — I10 Essential (primary) hypertension: Secondary | ICD-10-CM | POA: Diagnosis not present

## 2020-06-06 DIAGNOSIS — M25511 Pain in right shoulder: Secondary | ICD-10-CM | POA: Diagnosis not present

## 2020-06-06 DIAGNOSIS — M5412 Radiculopathy, cervical region: Secondary | ICD-10-CM | POA: Diagnosis not present

## 2020-06-06 DIAGNOSIS — S46911A Strain of unspecified muscle, fascia and tendon at shoulder and upper arm level, right arm, initial encounter: Secondary | ICD-10-CM | POA: Diagnosis not present

## 2020-06-06 DIAGNOSIS — X58XXXA Exposure to other specified factors, initial encounter: Secondary | ICD-10-CM | POA: Diagnosis not present

## 2020-06-06 DIAGNOSIS — E7800 Pure hypercholesterolemia, unspecified: Secondary | ICD-10-CM | POA: Diagnosis not present

## 2020-06-06 DIAGNOSIS — E119 Type 2 diabetes mellitus without complications: Secondary | ICD-10-CM | POA: Diagnosis not present

## 2020-06-06 DIAGNOSIS — Z6839 Body mass index (BMI) 39.0-39.9, adult: Secondary | ICD-10-CM | POA: Diagnosis not present

## 2020-06-11 DIAGNOSIS — M25511 Pain in right shoulder: Secondary | ICD-10-CM | POA: Diagnosis not present

## 2020-06-11 DIAGNOSIS — M542 Cervicalgia: Secondary | ICD-10-CM | POA: Diagnosis not present

## 2020-06-11 DIAGNOSIS — Z6837 Body mass index (BMI) 37.0-37.9, adult: Secondary | ICD-10-CM | POA: Diagnosis not present

## 2020-06-11 DIAGNOSIS — R1110 Vomiting, unspecified: Secondary | ICD-10-CM | POA: Diagnosis not present

## 2020-06-14 DIAGNOSIS — G8929 Other chronic pain: Secondary | ICD-10-CM | POA: Diagnosis not present

## 2020-06-14 DIAGNOSIS — M109 Gout, unspecified: Secondary | ICD-10-CM | POA: Diagnosis not present

## 2020-06-14 DIAGNOSIS — M791 Myalgia, unspecified site: Secondary | ICD-10-CM | POA: Diagnosis not present

## 2020-06-14 DIAGNOSIS — I10 Essential (primary) hypertension: Secondary | ICD-10-CM | POA: Diagnosis not present

## 2020-06-14 DIAGNOSIS — R7989 Other specified abnormal findings of blood chemistry: Secondary | ICD-10-CM | POA: Diagnosis not present

## 2020-06-14 DIAGNOSIS — E079 Disorder of thyroid, unspecified: Secondary | ICD-10-CM | POA: Diagnosis not present

## 2020-06-14 DIAGNOSIS — M25512 Pain in left shoulder: Secondary | ICD-10-CM | POA: Diagnosis not present

## 2020-06-14 DIAGNOSIS — E119 Type 2 diabetes mellitus without complications: Secondary | ICD-10-CM | POA: Diagnosis not present

## 2020-06-14 DIAGNOSIS — E7800 Pure hypercholesterolemia, unspecified: Secondary | ICD-10-CM | POA: Diagnosis not present

## 2020-06-14 DIAGNOSIS — M25511 Pain in right shoulder: Secondary | ICD-10-CM | POA: Diagnosis not present

## 2020-06-17 DIAGNOSIS — M542 Cervicalgia: Secondary | ICD-10-CM | POA: Diagnosis not present

## 2020-06-17 DIAGNOSIS — M25511 Pain in right shoulder: Secondary | ICD-10-CM | POA: Diagnosis not present

## 2020-06-17 DIAGNOSIS — R110 Nausea: Secondary | ICD-10-CM | POA: Diagnosis not present

## 2020-06-23 DIAGNOSIS — N184 Chronic kidney disease, stage 4 (severe): Secondary | ICD-10-CM | POA: Diagnosis not present

## 2020-06-23 DIAGNOSIS — E1122 Type 2 diabetes mellitus with diabetic chronic kidney disease: Secondary | ICD-10-CM | POA: Diagnosis not present

## 2020-06-24 DIAGNOSIS — E119 Type 2 diabetes mellitus without complications: Secondary | ICD-10-CM | POA: Diagnosis not present

## 2020-06-24 DIAGNOSIS — Z882 Allergy status to sulfonamides status: Secondary | ICD-10-CM | POA: Diagnosis not present

## 2020-06-24 DIAGNOSIS — E7800 Pure hypercholesterolemia, unspecified: Secondary | ICD-10-CM | POA: Diagnosis not present

## 2020-06-24 DIAGNOSIS — E282 Polycystic ovarian syndrome: Secondary | ICD-10-CM | POA: Diagnosis not present

## 2020-06-24 DIAGNOSIS — E079 Disorder of thyroid, unspecified: Secondary | ICD-10-CM | POA: Diagnosis not present

## 2020-06-24 DIAGNOSIS — M1991 Primary osteoarthritis, unspecified site: Secondary | ICD-10-CM | POA: Diagnosis not present

## 2020-06-24 DIAGNOSIS — M109 Gout, unspecified: Secondary | ICD-10-CM | POA: Diagnosis not present

## 2020-06-24 DIAGNOSIS — I10 Essential (primary) hypertension: Secondary | ICD-10-CM | POA: Diagnosis not present

## 2020-07-15 ENCOUNTER — Other Ambulatory Visit: Payer: Self-pay | Admitting: Nephrology

## 2020-07-15 ENCOUNTER — Other Ambulatory Visit (HOSPITAL_COMMUNITY): Payer: Self-pay | Admitting: Nephrology

## 2020-07-15 DIAGNOSIS — E6609 Other obesity due to excess calories: Secondary | ICD-10-CM | POA: Diagnosis not present

## 2020-07-15 DIAGNOSIS — E872 Acidosis, unspecified: Secondary | ICD-10-CM

## 2020-07-15 DIAGNOSIS — Z719 Counseling, unspecified: Secondary | ICD-10-CM | POA: Diagnosis not present

## 2020-07-15 DIAGNOSIS — Z79899 Other long term (current) drug therapy: Secondary | ICD-10-CM

## 2020-07-15 DIAGNOSIS — E1122 Type 2 diabetes mellitus with diabetic chronic kidney disease: Secondary | ICD-10-CM

## 2020-07-15 DIAGNOSIS — R809 Proteinuria, unspecified: Secondary | ICD-10-CM | POA: Diagnosis not present

## 2020-07-15 DIAGNOSIS — N9412 Deep dyspareunia: Secondary | ICD-10-CM | POA: Diagnosis not present

## 2020-07-15 DIAGNOSIS — Z5181 Encounter for therapeutic drug level monitoring: Secondary | ICD-10-CM | POA: Diagnosis not present

## 2020-07-15 DIAGNOSIS — Z794 Long term (current) use of insulin: Secondary | ICD-10-CM

## 2020-07-15 DIAGNOSIS — I129 Hypertensive chronic kidney disease with stage 1 through stage 4 chronic kidney disease, or unspecified chronic kidney disease: Secondary | ICD-10-CM | POA: Diagnosis not present

## 2020-07-15 DIAGNOSIS — N189 Chronic kidney disease, unspecified: Secondary | ICD-10-CM | POA: Diagnosis not present

## 2020-07-18 ENCOUNTER — Other Ambulatory Visit (HOSPITAL_COMMUNITY): Payer: Self-pay | Admitting: Nephrology

## 2020-07-18 DIAGNOSIS — N9412 Deep dyspareunia: Secondary | ICD-10-CM

## 2020-07-18 DIAGNOSIS — R809 Proteinuria, unspecified: Secondary | ICD-10-CM

## 2020-07-18 DIAGNOSIS — Z716 Tobacco abuse counseling: Secondary | ICD-10-CM

## 2020-07-18 DIAGNOSIS — E6609 Other obesity due to excess calories: Secondary | ICD-10-CM

## 2020-07-18 DIAGNOSIS — Z79899 Other long term (current) drug therapy: Secondary | ICD-10-CM

## 2020-07-18 DIAGNOSIS — E1122 Type 2 diabetes mellitus with diabetic chronic kidney disease: Secondary | ICD-10-CM

## 2020-07-18 DIAGNOSIS — Z5181 Encounter for therapeutic drug level monitoring: Secondary | ICD-10-CM

## 2020-07-18 DIAGNOSIS — I129 Hypertensive chronic kidney disease with stage 1 through stage 4 chronic kidney disease, or unspecified chronic kidney disease: Secondary | ICD-10-CM

## 2020-07-18 DIAGNOSIS — E872 Acidosis, unspecified: Secondary | ICD-10-CM

## 2020-07-23 DIAGNOSIS — M25512 Pain in left shoulder: Secondary | ICD-10-CM | POA: Diagnosis not present

## 2020-07-23 DIAGNOSIS — M25552 Pain in left hip: Secondary | ICD-10-CM | POA: Diagnosis not present

## 2020-07-23 DIAGNOSIS — E1122 Type 2 diabetes mellitus with diabetic chronic kidney disease: Secondary | ICD-10-CM | POA: Diagnosis not present

## 2020-07-26 DIAGNOSIS — R509 Fever, unspecified: Secondary | ICD-10-CM | POA: Diagnosis not present

## 2020-07-26 DIAGNOSIS — E039 Hypothyroidism, unspecified: Secondary | ICD-10-CM | POA: Diagnosis not present

## 2020-07-26 DIAGNOSIS — G5700 Lesion of sciatic nerve, unspecified lower limb: Secondary | ICD-10-CM | POA: Diagnosis not present

## 2020-07-26 DIAGNOSIS — I10 Essential (primary) hypertension: Secondary | ICD-10-CM | POA: Diagnosis not present

## 2020-07-26 DIAGNOSIS — M25552 Pain in left hip: Secondary | ICD-10-CM | POA: Diagnosis not present

## 2020-07-26 DIAGNOSIS — E785 Hyperlipidemia, unspecified: Secondary | ICD-10-CM | POA: Diagnosis not present

## 2020-07-26 DIAGNOSIS — M719 Bursopathy, unspecified: Secondary | ICD-10-CM | POA: Diagnosis not present

## 2020-07-26 DIAGNOSIS — Z882 Allergy status to sulfonamides status: Secondary | ICD-10-CM | POA: Diagnosis not present

## 2020-07-26 DIAGNOSIS — M7062 Trochanteric bursitis, left hip: Secondary | ICD-10-CM | POA: Diagnosis not present

## 2020-07-26 DIAGNOSIS — G5701 Lesion of sciatic nerve, right lower limb: Secondary | ICD-10-CM | POA: Diagnosis not present

## 2020-07-26 DIAGNOSIS — F419 Anxiety disorder, unspecified: Secondary | ICD-10-CM | POA: Diagnosis not present

## 2020-07-27 DIAGNOSIS — M25552 Pain in left hip: Secondary | ICD-10-CM | POA: Diagnosis not present

## 2020-07-28 NOTE — Progress Notes (Deleted)
   Office Visit Note  Patient: Angela Baker             Date of Birth: 1986/07/26           MRN: [id number]             PCP: Caryl Bis, MD Referring: Lavella Lemons, [place] Visit Date: 08/06/2020 Occupation: @GUAROCC @  Subjective:  No chief complaint on file.   History of Present Illness: Angela Baker is a 34 y.o. female ***   Activities of Daily Living:  Patient reports morning stiffness for *** {minute/hour:19697}.   Patient {ACTIONS;DENIES/REPORTS:21021675::"Denies"} nocturnal pain.  Difficulty dressing/grooming: {ACTIONS;DENIES/REPORTS:21021675::"Denies"} Difficulty climbing stairs: {ACTIONS;DENIES/REPORTS:21021675::"Denies"} Difficulty getting out of chair: {ACTIONS;DENIES/REPORTS:21021675::"Denies"} Difficulty using hands for taps, buttons, cutlery, and/or writing: {ACTIONS;DENIES/REPORTS:21021675::"Denies"}  No Rheumatology ROS completed.   PMFS History:  There are no problems to display for this patient.   No past medical history on file.  No family history on file. *** The histories are not reviewed yet. Please review them in the "History" navigator section and refresh this [place]. Social History   Social History Narrative  . Not on file    There is no immunization history on file for this patient.   Objective: Vital Signs: There were no vitals taken for this visit.   Physical Exam   Musculoskeletal Exam: ***  CDAI Exam: CDAI Score: -- Patient Global: --; Provider Global: -- Swollen: --; Tender: -- Joint Exam 08/06/2020   No joint exam has been documented for this visit   There is currently no information documented on the homunculus. Go to the Rheumatology activity and complete the homunculus joint exam.  Investigation: No additional findings.  Imaging: No results found.  Recent Labs: No results found for: WBC, HGB, PLT, NA, K, CL, CO2, GLUCOSE, BUN, CREATININE, BILITOT, ALKPHOS, AST, ALT, PROT, ALBUMIN, CALCIUM, GFRAA, QFTBGOLD,  QFTBGOLDPLUS  Speciality Comments: No specialty comments available.  Procedures:  No procedures performed Allergies: Patient has no allergy information on record.   Assessment / Plan:     Visit Diagnoses: Idiopathic chronic gout without tophus, unspecified site - uric acid 13.0  PCOS (polycystic ovarian syndrome)  Orders: No orders of the defined types were placed in this encounter.  No orders of the defined types were placed in this encounter.   Face-to-face time spent with patient was *** minutes. Greater than 50% of time was spent in counseling and coordination of care.  Follow-Up Instructions: No follow-ups on file.   Ofilia Neas, PA-C  Note - This record has been created using Dragon software.  Chart creation errors have been sought, but may not always  have been located. Such creation errors do not reflect on  the standard of medical care.

## 2020-07-29 ENCOUNTER — Encounter (HOSPITAL_COMMUNITY): Payer: Self-pay

## 2020-07-29 ENCOUNTER — Ambulatory Visit (HOSPITAL_COMMUNITY): Payer: Medicare HMO

## 2020-08-06 ENCOUNTER — Ambulatory Visit: Payer: Medicare Other | Admitting: Rheumatology

## 2020-08-06 DIAGNOSIS — E282 Polycystic ovarian syndrome: Secondary | ICD-10-CM

## 2020-08-06 DIAGNOSIS — R809 Proteinuria, unspecified: Secondary | ICD-10-CM

## 2020-08-06 DIAGNOSIS — M1A00X0 Idiopathic chronic gout, unspecified site, without tophus (tophi): Secondary | ICD-10-CM

## 2020-08-06 DIAGNOSIS — E1122 Type 2 diabetes mellitus with diabetic chronic kidney disease: Secondary | ICD-10-CM

## 2020-08-06 DIAGNOSIS — R7989 Other specified abnormal findings of blood chemistry: Secondary | ICD-10-CM

## 2020-08-07 DIAGNOSIS — M109 Gout, unspecified: Secondary | ICD-10-CM | POA: Diagnosis not present

## 2020-08-07 DIAGNOSIS — E7800 Pure hypercholesterolemia, unspecified: Secondary | ICD-10-CM | POA: Diagnosis not present

## 2020-08-07 DIAGNOSIS — Z6839 Body mass index (BMI) 39.0-39.9, adult: Secondary | ICD-10-CM | POA: Diagnosis not present

## 2020-08-07 DIAGNOSIS — I10 Essential (primary) hypertension: Secondary | ICD-10-CM | POA: Diagnosis not present

## 2020-08-07 DIAGNOSIS — M10061 Idiopathic gout, right knee: Secondary | ICD-10-CM | POA: Diagnosis not present

## 2020-08-07 DIAGNOSIS — E119 Type 2 diabetes mellitus without complications: Secondary | ICD-10-CM | POA: Diagnosis not present

## 2020-08-07 DIAGNOSIS — E669 Obesity, unspecified: Secondary | ICD-10-CM | POA: Diagnosis not present

## 2020-08-07 DIAGNOSIS — F329 Major depressive disorder, single episode, unspecified: Secondary | ICD-10-CM | POA: Diagnosis not present

## 2020-08-11 DIAGNOSIS — E7849 Other hyperlipidemia: Secondary | ICD-10-CM | POA: Diagnosis not present

## 2020-08-11 DIAGNOSIS — N184 Chronic kidney disease, stage 4 (severe): Secondary | ICD-10-CM | POA: Diagnosis not present

## 2020-08-11 DIAGNOSIS — I129 Hypertensive chronic kidney disease with stage 1 through stage 4 chronic kidney disease, or unspecified chronic kidney disease: Secondary | ICD-10-CM | POA: Diagnosis not present

## 2020-08-11 DIAGNOSIS — E1122 Type 2 diabetes mellitus with diabetic chronic kidney disease: Secondary | ICD-10-CM | POA: Diagnosis not present

## 2020-08-19 ENCOUNTER — Encounter (HOSPITAL_COMMUNITY): Payer: Self-pay

## 2020-08-19 ENCOUNTER — Ambulatory Visit (HOSPITAL_COMMUNITY): Payer: Medicare HMO

## 2020-08-28 DIAGNOSIS — M109 Gout, unspecified: Secondary | ICD-10-CM | POA: Diagnosis not present

## 2020-08-28 DIAGNOSIS — N184 Chronic kidney disease, stage 4 (severe): Secondary | ICD-10-CM | POA: Diagnosis not present

## 2020-08-31 DIAGNOSIS — I10 Essential (primary) hypertension: Secondary | ICD-10-CM | POA: Diagnosis not present

## 2020-08-31 DIAGNOSIS — D649 Anemia, unspecified: Secondary | ICD-10-CM | POA: Diagnosis not present

## 2020-08-31 DIAGNOSIS — N184 Chronic kidney disease, stage 4 (severe): Secondary | ICD-10-CM | POA: Diagnosis not present

## 2020-08-31 DIAGNOSIS — M109 Gout, unspecified: Secondary | ICD-10-CM | POA: Diagnosis not present

## 2020-08-31 DIAGNOSIS — J4520 Mild intermittent asthma, uncomplicated: Secondary | ICD-10-CM | POA: Diagnosis not present

## 2020-08-31 DIAGNOSIS — E039 Hypothyroidism, unspecified: Secondary | ICD-10-CM | POA: Diagnosis not present

## 2020-08-31 DIAGNOSIS — F329 Major depressive disorder, single episode, unspecified: Secondary | ICD-10-CM | POA: Diagnosis not present

## 2020-08-31 DIAGNOSIS — E782 Mixed hyperlipidemia: Secondary | ICD-10-CM | POA: Diagnosis not present

## 2020-08-31 DIAGNOSIS — E1165 Type 2 diabetes mellitus with hyperglycemia: Secondary | ICD-10-CM | POA: Diagnosis not present

## 2020-08-31 DIAGNOSIS — F419 Anxiety disorder, unspecified: Secondary | ICD-10-CM | POA: Diagnosis not present

## 2020-08-31 DIAGNOSIS — N926 Irregular menstruation, unspecified: Secondary | ICD-10-CM | POA: Diagnosis not present

## 2020-08-31 DIAGNOSIS — M545 Low back pain: Secondary | ICD-10-CM | POA: Diagnosis not present

## 2020-09-03 ENCOUNTER — Ambulatory Visit: Payer: Medicare Other | Admitting: Rheumatology

## 2020-09-10 DIAGNOSIS — I129 Hypertensive chronic kidney disease with stage 1 through stage 4 chronic kidney disease, or unspecified chronic kidney disease: Secondary | ICD-10-CM | POA: Diagnosis not present

## 2020-09-10 DIAGNOSIS — E1122 Type 2 diabetes mellitus with diabetic chronic kidney disease: Secondary | ICD-10-CM | POA: Diagnosis not present

## 2020-09-10 DIAGNOSIS — N184 Chronic kidney disease, stage 4 (severe): Secondary | ICD-10-CM | POA: Diagnosis not present

## 2020-10-06 DIAGNOSIS — M25569 Pain in unspecified knee: Secondary | ICD-10-CM | POA: Diagnosis not present

## 2020-10-29 DIAGNOSIS — N184 Chronic kidney disease, stage 4 (severe): Secondary | ICD-10-CM | POA: Diagnosis not present

## 2020-10-29 DIAGNOSIS — M109 Gout, unspecified: Secondary | ICD-10-CM | POA: Diagnosis not present

## 2020-11-10 DIAGNOSIS — E1122 Type 2 diabetes mellitus with diabetic chronic kidney disease: Secondary | ICD-10-CM | POA: Diagnosis not present

## 2020-11-10 DIAGNOSIS — I129 Hypertensive chronic kidney disease with stage 1 through stage 4 chronic kidney disease, or unspecified chronic kidney disease: Secondary | ICD-10-CM | POA: Diagnosis not present

## 2020-11-10 DIAGNOSIS — N184 Chronic kidney disease, stage 4 (severe): Secondary | ICD-10-CM | POA: Diagnosis not present

## 2020-11-12 DIAGNOSIS — M109 Gout, unspecified: Secondary | ICD-10-CM | POA: Diagnosis not present

## 2020-12-21 DIAGNOSIS — E1165 Type 2 diabetes mellitus with hyperglycemia: Secondary | ICD-10-CM | POA: Diagnosis not present

## 2020-12-21 DIAGNOSIS — M109 Gout, unspecified: Secondary | ICD-10-CM | POA: Diagnosis not present

## 2020-12-21 DIAGNOSIS — E782 Mixed hyperlipidemia: Secondary | ICD-10-CM | POA: Diagnosis not present

## 2020-12-21 DIAGNOSIS — N184 Chronic kidney disease, stage 4 (severe): Secondary | ICD-10-CM | POA: Diagnosis not present

## 2020-12-21 DIAGNOSIS — R202 Paresthesia of skin: Secondary | ICD-10-CM | POA: Diagnosis not present

## 2020-12-21 DIAGNOSIS — R079 Chest pain, unspecified: Secondary | ICD-10-CM | POA: Diagnosis not present

## 2020-12-21 DIAGNOSIS — E1122 Type 2 diabetes mellitus with diabetic chronic kidney disease: Secondary | ICD-10-CM | POA: Diagnosis not present

## 2020-12-21 DIAGNOSIS — J4520 Mild intermittent asthma, uncomplicated: Secondary | ICD-10-CM | POA: Diagnosis not present

## 2020-12-21 DIAGNOSIS — Z6837 Body mass index (BMI) 37.0-37.9, adult: Secondary | ICD-10-CM | POA: Diagnosis not present

## 2020-12-21 DIAGNOSIS — E7849 Other hyperlipidemia: Secondary | ICD-10-CM | POA: Diagnosis not present

## 2020-12-21 DIAGNOSIS — F329 Major depressive disorder, single episode, unspecified: Secondary | ICD-10-CM | POA: Diagnosis not present

## 2020-12-21 DIAGNOSIS — I10 Essential (primary) hypertension: Secondary | ICD-10-CM | POA: Diagnosis not present

## 2020-12-21 DIAGNOSIS — F419 Anxiety disorder, unspecified: Secondary | ICD-10-CM | POA: Diagnosis not present

## 2020-12-21 DIAGNOSIS — E039 Hypothyroidism, unspecified: Secondary | ICD-10-CM | POA: Diagnosis not present

## 2020-12-30 DIAGNOSIS — Z20828 Contact with and (suspected) exposure to other viral communicable diseases: Secondary | ICD-10-CM | POA: Diagnosis not present

## 2020-12-30 DIAGNOSIS — R059 Cough, unspecified: Secondary | ICD-10-CM | POA: Diagnosis not present

## 2021-01-04 DIAGNOSIS — E7800 Pure hypercholesterolemia, unspecified: Secondary | ICD-10-CM | POA: Diagnosis not present

## 2021-01-04 DIAGNOSIS — I10 Essential (primary) hypertension: Secondary | ICD-10-CM | POA: Diagnosis not present

## 2021-01-04 DIAGNOSIS — E669 Obesity, unspecified: Secondary | ICD-10-CM | POA: Diagnosis not present

## 2021-01-04 DIAGNOSIS — E119 Type 2 diabetes mellitus without complications: Secondary | ICD-10-CM | POA: Diagnosis not present

## 2021-01-04 DIAGNOSIS — K029 Dental caries, unspecified: Secondary | ICD-10-CM | POA: Diagnosis not present

## 2021-01-04 DIAGNOSIS — M109 Gout, unspecified: Secondary | ICD-10-CM | POA: Diagnosis not present

## 2021-01-04 DIAGNOSIS — E079 Disorder of thyroid, unspecified: Secondary | ICD-10-CM | POA: Diagnosis not present

## 2021-01-04 DIAGNOSIS — Z79890 Hormone replacement therapy (postmenopausal): Secondary | ICD-10-CM | POA: Diagnosis not present

## 2021-01-04 DIAGNOSIS — Z7984 Long term (current) use of oral hypoglycemic drugs: Secondary | ICD-10-CM | POA: Diagnosis not present

## 2021-01-07 DIAGNOSIS — U071 COVID-19: Secondary | ICD-10-CM | POA: Diagnosis not present

## 2021-01-07 DIAGNOSIS — Z888 Allergy status to other drugs, medicaments and biological substances status: Secondary | ICD-10-CM | POA: Diagnosis not present

## 2021-01-07 DIAGNOSIS — Z881 Allergy status to other antibiotic agents status: Secondary | ICD-10-CM | POA: Diagnosis not present

## 2021-01-07 DIAGNOSIS — R109 Unspecified abdominal pain: Secondary | ICD-10-CM | POA: Diagnosis not present

## 2021-01-07 DIAGNOSIS — Z882 Allergy status to sulfonamides status: Secondary | ICD-10-CM | POA: Diagnosis not present

## 2021-02-02 ENCOUNTER — Encounter: Payer: Self-pay | Admitting: *Deleted

## 2021-02-03 ENCOUNTER — Ambulatory Visit: Payer: Medicare HMO | Admitting: Cardiology

## 2021-02-03 NOTE — Progress Notes (Deleted)
Clinical Summary Angela Baker is a 35 y.o.female seen as a new consult, referred by PA Skill man for the following medical problems.   1. Chest pain  CAD risk factors: HTN, HL, DM2  2. CKD    3. HTN   4. Hyperlipidemia Past Medical History:  Diagnosis Date  . Anxiety   . Chest pain   . CKD (chronic kidney disease)   . Diabetes mellitus without complication ([place])   . Gout   . Hyperlipidemia   . Hypertension   . Hypothyroidism   . Major depressive disorder   . Mild asthma   . Paresthesia      Allergies  Allergen Reactions  . Doxycycline   . Other     HCTZ - DEHYDRATION   . Prednisone   . Sulfa Antibiotics      Current Outpatient Medications  Medication Sig Dispense Refill  . amLODipine (NORVASC) 10 MG tablet Take 10 mg by mouth daily.    Marland Kitchen atorvastatin (LIPITOR) 10 MG tablet Take 10 mg by mouth daily.    . cetirizine (ZYRTEC) 10 MG tablet Take 10 mg by mouth daily.    . Dulaglutide (TRULICITY) 6.14 ER/1.5QM SOPN Inject into the skin once a week.    . DULoxetine (CYMBALTA) 60 MG capsule Take 60 mg by mouth daily.    . Febuxostat (ULORIC) 80 MG TABS Take by mouth.    . labetalol (NORMODYNE) 200 MG tablet Take 200 mg by mouth daily.    Marland Kitchen LEVOTHYROXINE SODIUM PO Take 275 mcg by mouth.    Marland Kitchen LORazepam (ATIVAN) 1 MG tablet Take 1 mg by mouth every 8 (eight) hours.    . metFORMIN (GLUCOPHAGE) 850 MG tablet Take 850 mg by mouth 2 (two) times daily with a meal.    . traMADol (ULTRAM) 50 MG tablet Take by mouth every 6 (six) hours as needed.     No current facility-administered medications for this visit.     *** The histories are not reviewed yet. Please review them in the "History" navigator section and refresh this [place].   Allergies  Allergen Reactions  . Doxycycline   . Other     HCTZ - DEHYDRATION   . Prednisone   . Sulfa Antibiotics       Family History  Problem Relation Age of Onset  . Heart disease Mother   . Diabetes Mother   .  Hypertension Mother   . Breast cancer Father      Social History Angela Baker has no history on file for tobacco use. Angela Baker has no history on file for alcohol use.   Review of Systems CONSTITUTIONAL: No weight loss, fever, chills, weakness or fatigue.  HEENT: Eyes: No visual loss, blurred vision, double vision or yellow sclerae.No hearing loss, sneezing, congestion, runny nose or sore throat.  SKIN: No rash or itching.  CARDIOVASCULAR:  RESPIRATORY: No shortness of breath, cough or sputum.  GASTROINTESTINAL: No anorexia, nausea, vomiting or diarrhea. No abdominal pain or blood.  GENITOURINARY: No burning on urination, no polyuria NEUROLOGICAL: No headache, dizziness, syncope, paralysis, ataxia, numbness or tingling in the extremities. No change in bowel or bladder control.  MUSCULOSKELETAL: No muscle, back pain, joint pain or stiffness.  LYMPHATICS: No enlarged nodes. No history of splenectomy.  PSYCHIATRIC: No history of depression or anxiety.  ENDOCRINOLOGIC: No reports of sweating, cold or heat intolerance. No polyuria or polydipsia.  Marland Kitchen   Physical Examination There were no vitals filed for this visit.  There were no vitals filed for this visit.  Gen: resting comfortably, no acute distress HEENT: no scleral icterus, pupils equal round and reactive, no palptable cervical adenopathy,  CV Resp: Clear to auscultation bilaterally GI: abdomen is soft, non-tender, non-distended, normal bowel sounds, no hepatosplenomegaly MSK: extremities are warm, no edema.  Skin: warm, no rash Neuro:  no focal deficits Psych: appropriate affect   Diagnostic Studies     Assessment and Plan        Arnoldo Lenis, M.D., F.A.C.C.

## 2021-02-16 DIAGNOSIS — E539 Vitamin B deficiency, unspecified: Secondary | ICD-10-CM | POA: Diagnosis not present

## 2021-02-16 DIAGNOSIS — M79604 Pain in right leg: Secondary | ICD-10-CM | POA: Diagnosis not present

## 2021-02-20 DIAGNOSIS — N289 Disorder of kidney and ureter, unspecified: Secondary | ICD-10-CM | POA: Diagnosis not present

## 2021-02-20 DIAGNOSIS — E7800 Pure hypercholesterolemia, unspecified: Secondary | ICD-10-CM | POA: Diagnosis not present

## 2021-02-20 DIAGNOSIS — Z0389 Encounter for observation for other suspected diseases and conditions ruled out: Secondary | ICD-10-CM | POA: Diagnosis not present

## 2021-02-20 DIAGNOSIS — R0602 Shortness of breath: Secondary | ICD-10-CM | POA: Diagnosis not present

## 2021-02-20 DIAGNOSIS — M25461 Effusion, right knee: Secondary | ICD-10-CM | POA: Diagnosis not present

## 2021-02-20 DIAGNOSIS — E669 Obesity, unspecified: Secondary | ICD-10-CM | POA: Diagnosis not present

## 2021-02-20 DIAGNOSIS — I10 Essential (primary) hypertension: Secondary | ICD-10-CM | POA: Diagnosis not present

## 2021-02-20 DIAGNOSIS — F17200 Nicotine dependence, unspecified, uncomplicated: Secondary | ICD-10-CM | POA: Diagnosis not present

## 2021-02-20 DIAGNOSIS — E119 Type 2 diabetes mellitus without complications: Secondary | ICD-10-CM | POA: Diagnosis not present

## 2021-02-20 DIAGNOSIS — Z6835 Body mass index (BMI) 35.0-35.9, adult: Secondary | ICD-10-CM | POA: Diagnosis not present

## 2021-02-20 DIAGNOSIS — M25561 Pain in right knee: Secondary | ICD-10-CM | POA: Diagnosis not present

## 2021-03-02 DIAGNOSIS — R300 Dysuria: Secondary | ICD-10-CM | POA: Diagnosis not present

## 2021-03-02 DIAGNOSIS — R109 Unspecified abdominal pain: Secondary | ICD-10-CM | POA: Diagnosis not present

## 2021-03-02 DIAGNOSIS — M109 Gout, unspecified: Secondary | ICD-10-CM | POA: Diagnosis not present

## 2021-03-12 DIAGNOSIS — M109 Gout, unspecified: Secondary | ICD-10-CM | POA: Diagnosis not present

## 2021-03-27 DIAGNOSIS — I129 Hypertensive chronic kidney disease with stage 1 through stage 4 chronic kidney disease, or unspecified chronic kidney disease: Secondary | ICD-10-CM | POA: Diagnosis not present

## 2021-03-27 DIAGNOSIS — Z720 Tobacco use: Secondary | ICD-10-CM | POA: Diagnosis not present

## 2021-03-27 DIAGNOSIS — E7800 Pure hypercholesterolemia, unspecified: Secondary | ICD-10-CM | POA: Diagnosis not present

## 2021-03-27 DIAGNOSIS — E079 Disorder of thyroid, unspecified: Secondary | ICD-10-CM | POA: Diagnosis not present

## 2021-03-27 DIAGNOSIS — M10061 Idiopathic gout, right knee: Secondary | ICD-10-CM | POA: Diagnosis not present

## 2021-03-27 DIAGNOSIS — N189 Chronic kidney disease, unspecified: Secondary | ICD-10-CM | POA: Diagnosis not present

## 2021-03-27 DIAGNOSIS — M10361 Gout due to renal impairment, right knee: Secondary | ICD-10-CM | POA: Diagnosis not present

## 2021-03-27 DIAGNOSIS — E1122 Type 2 diabetes mellitus with diabetic chronic kidney disease: Secondary | ICD-10-CM | POA: Diagnosis not present

## 2021-03-27 DIAGNOSIS — I10 Essential (primary) hypertension: Secondary | ICD-10-CM | POA: Diagnosis not present

## 2021-04-05 ENCOUNTER — Ambulatory Visit: Payer: Medicare HMO | Admitting: Cardiology

## 2021-04-05 DIAGNOSIS — F17200 Nicotine dependence, unspecified, uncomplicated: Secondary | ICD-10-CM | POA: Diagnosis not present

## 2021-04-05 DIAGNOSIS — E7800 Pure hypercholesterolemia, unspecified: Secondary | ICD-10-CM | POA: Diagnosis not present

## 2021-04-05 DIAGNOSIS — E079 Disorder of thyroid, unspecified: Secondary | ICD-10-CM | POA: Diagnosis not present

## 2021-04-05 DIAGNOSIS — E119 Type 2 diabetes mellitus without complications: Secondary | ICD-10-CM | POA: Diagnosis not present

## 2021-04-05 DIAGNOSIS — Z6839 Body mass index (BMI) 39.0-39.9, adult: Secondary | ICD-10-CM | POA: Diagnosis not present

## 2021-04-05 DIAGNOSIS — S6991XA Unspecified injury of right wrist, hand and finger(s), initial encounter: Secondary | ICD-10-CM | POA: Diagnosis not present

## 2021-04-05 DIAGNOSIS — E669 Obesity, unspecified: Secondary | ICD-10-CM | POA: Diagnosis not present

## 2021-04-05 DIAGNOSIS — E3451 Complete androgen insensitivity syndrome: Secondary | ICD-10-CM | POA: Diagnosis not present

## 2021-04-05 DIAGNOSIS — I10 Essential (primary) hypertension: Secondary | ICD-10-CM | POA: Diagnosis not present

## 2021-04-05 DIAGNOSIS — Z8249 Family history of ischemic heart disease and other diseases of the circulatory system: Secondary | ICD-10-CM | POA: Diagnosis not present

## 2021-04-05 DIAGNOSIS — M654 Radial styloid tenosynovitis [de Quervain]: Secondary | ICD-10-CM | POA: Diagnosis not present

## 2021-04-05 NOTE — Progress Notes (Deleted)
Clinical Summary Ms. Leckey is a 35 y.o.female  1. Chest pain   2. HTN   3. Hyperlipidemia   4. CKD   Past Medical History:  Diagnosis Date  . Anxiety   . Chest pain   . CKD (chronic kidney disease)   . Diabetes mellitus without complication ([place])   . Gout   . Hyperlipidemia   . Hypertension   . Hypothyroidism   . Major depressive disorder   . Mild asthma   . Paresthesia      Allergies  Allergen Reactions  . Doxycycline   . Other     HCTZ - DEHYDRATION   . Prednisone   . Sulfa Antibiotics      Current Outpatient Medications  Medication Sig Dispense Refill  . amLODipine (NORVASC) 10 MG tablet Take 10 mg by mouth daily.    Marland Kitchen atorvastatin (LIPITOR) 10 MG tablet Take 10 mg by mouth daily.    . cetirizine (ZYRTEC) 10 MG tablet Take 10 mg by mouth daily.    . Dulaglutide (TRULICITY) 6.07 PX/1.0GY SOPN Inject into the skin once a week.    . DULoxetine (CYMBALTA) 60 MG capsule Take 60 mg by mouth daily.    . Febuxostat (ULORIC) 80 MG TABS Take by mouth.    . labetalol (NORMODYNE) 200 MG tablet Take 200 mg by mouth daily.    Marland Kitchen LEVOTHYROXINE SODIUM PO Take 275 mcg by mouth.    Marland Kitchen LORazepam (ATIVAN) 1 MG tablet Take 1 mg by mouth every 8 (eight) hours.    . metFORMIN (GLUCOPHAGE) 850 MG tablet Take 850 mg by mouth 2 (two) times daily with a meal.    . traMADol (ULTRAM) 50 MG tablet Take by mouth every 6 (six) hours as needed.     No current facility-administered medications for this visit.     *** The histories are not reviewed yet. Please review them in the "History" navigator section and refresh this [employer].   Allergies  Allergen Reactions  . Doxycycline   . Other     HCTZ - DEHYDRATION   . Prednisone   . Sulfa Antibiotics       Family History  Problem Relation Age of Onset  . Heart disease Mother   . Diabetes Mother   . Hypertension Mother   . Breast cancer Father      Social History Ms. Perl has no history on file for tobacco  use. Ms. Bury has no history on file for alcohol use.   Review of Systems CONSTITUTIONAL: No weight loss, fever, chills, weakness or fatigue.  HEENT: Eyes: No visual loss, blurred vision, double vision or yellow sclerae.No hearing loss, sneezing, congestion, runny nose or sore throat.  SKIN: No rash or itching.  CARDIOVASCULAR:  RESPIRATORY: No shortness of breath, cough or sputum.  GASTROINTESTINAL: No anorexia, nausea, vomiting or diarrhea. No abdominal pain or blood.  GENITOURINARY: No burning on urination, no polyuria NEUROLOGICAL: No headache, dizziness, syncope, paralysis, ataxia, numbness or tingling in the extremities. No change in bowel or bladder control.  MUSCULOSKELETAL: No muscle, back pain, joint pain or stiffness.  LYMPHATICS: No enlarged nodes. No history of splenectomy.  PSYCHIATRIC: No history of depression or anxiety.  ENDOCRINOLOGIC: No reports of sweating, cold or heat intolerance. No polyuria or polydipsia.  Marland Kitchen   Physical Examination There were no vitals filed for this visit. There were no vitals filed for this visit.  Gen: resting comfortably, no acute distress HEENT: no scleral icterus, pupils equal  round and reactive, no palptable cervical adenopathy,  CV Resp: Clear to auscultation bilaterally GI: abdomen is soft, non-tender, non-distended, normal bowel sounds, no hepatosplenomegaly MSK: extremities are warm, no edema.  Skin: warm, no rash Neuro:  no focal deficits Psych: appropriate affect   Diagnostic Studies     Assessment and Plan        Arnoldo Lenis, M.D., F.A.C.C.

## 2021-04-06 DIAGNOSIS — S6991XA Unspecified injury of right wrist, hand and finger(s), initial encounter: Secondary | ICD-10-CM | POA: Diagnosis not present

## 2021-04-08 DIAGNOSIS — M778 Other enthesopathies, not elsewhere classified: Secondary | ICD-10-CM | POA: Diagnosis not present

## 2021-04-10 DIAGNOSIS — N184 Chronic kidney disease, stage 4 (severe): Secondary | ICD-10-CM | POA: Diagnosis not present

## 2021-04-10 DIAGNOSIS — E7849 Other hyperlipidemia: Secondary | ICD-10-CM | POA: Diagnosis not present

## 2021-04-10 DIAGNOSIS — I129 Hypertensive chronic kidney disease with stage 1 through stage 4 chronic kidney disease, or unspecified chronic kidney disease: Secondary | ICD-10-CM | POA: Diagnosis not present

## 2021-04-10 DIAGNOSIS — E1122 Type 2 diabetes mellitus with diabetic chronic kidney disease: Secondary | ICD-10-CM | POA: Diagnosis not present

## 2021-04-26 DIAGNOSIS — E1122 Type 2 diabetes mellitus with diabetic chronic kidney disease: Secondary | ICD-10-CM | POA: Diagnosis not present

## 2021-04-26 DIAGNOSIS — M109 Gout, unspecified: Secondary | ICD-10-CM | POA: Diagnosis not present

## 2021-04-26 DIAGNOSIS — E039 Hypothyroidism, unspecified: Secondary | ICD-10-CM | POA: Diagnosis not present

## 2021-04-26 DIAGNOSIS — E7849 Other hyperlipidemia: Secondary | ICD-10-CM | POA: Diagnosis not present

## 2021-04-26 DIAGNOSIS — N184 Chronic kidney disease, stage 4 (severe): Secondary | ICD-10-CM | POA: Diagnosis not present

## 2021-04-26 DIAGNOSIS — N189 Chronic kidney disease, unspecified: Secondary | ICD-10-CM | POA: Diagnosis not present

## 2021-04-26 DIAGNOSIS — L03113 Cellulitis of right upper limb: Secondary | ICD-10-CM | POA: Diagnosis not present

## 2021-04-26 DIAGNOSIS — M779 Enthesopathy, unspecified: Secondary | ICD-10-CM | POA: Diagnosis not present

## 2021-04-26 DIAGNOSIS — E1165 Type 2 diabetes mellitus with hyperglycemia: Secondary | ICD-10-CM | POA: Diagnosis not present

## 2021-04-26 DIAGNOSIS — I129 Hypertensive chronic kidney disease with stage 1 through stage 4 chronic kidney disease, or unspecified chronic kidney disease: Secondary | ICD-10-CM | POA: Diagnosis not present

## 2021-04-26 DIAGNOSIS — J4520 Mild intermittent asthma, uncomplicated: Secondary | ICD-10-CM | POA: Diagnosis not present

## 2021-04-26 DIAGNOSIS — I10 Essential (primary) hypertension: Secondary | ICD-10-CM | POA: Diagnosis not present

## 2021-04-26 DIAGNOSIS — F17200 Nicotine dependence, unspecified, uncomplicated: Secondary | ICD-10-CM | POA: Diagnosis not present

## 2021-04-26 DIAGNOSIS — N926 Irregular menstruation, unspecified: Secondary | ICD-10-CM | POA: Diagnosis not present

## 2021-05-03 DIAGNOSIS — Z8249 Family history of ischemic heart disease and other diseases of the circulatory system: Secondary | ICD-10-CM | POA: Diagnosis not present

## 2021-05-03 DIAGNOSIS — E119 Type 2 diabetes mellitus without complications: Secondary | ICD-10-CM | POA: Diagnosis not present

## 2021-05-03 DIAGNOSIS — E079 Disorder of thyroid, unspecified: Secondary | ICD-10-CM | POA: Diagnosis not present

## 2021-05-03 DIAGNOSIS — M10032 Idiopathic gout, left wrist: Secondary | ICD-10-CM | POA: Diagnosis not present

## 2021-05-03 DIAGNOSIS — M25532 Pain in left wrist: Secondary | ICD-10-CM | POA: Diagnosis not present

## 2021-05-03 DIAGNOSIS — M25531 Pain in right wrist: Secondary | ICD-10-CM | POA: Diagnosis not present

## 2021-05-03 DIAGNOSIS — L0390 Cellulitis, unspecified: Secondary | ICD-10-CM | POA: Diagnosis not present

## 2021-05-03 DIAGNOSIS — M10031 Idiopathic gout, right wrist: Secondary | ICD-10-CM | POA: Diagnosis not present

## 2021-05-03 DIAGNOSIS — Z833 Family history of diabetes mellitus: Secondary | ICD-10-CM | POA: Diagnosis not present

## 2021-05-03 DIAGNOSIS — M10039 Idiopathic gout, unspecified wrist: Secondary | ICD-10-CM | POA: Diagnosis not present

## 2021-05-03 DIAGNOSIS — I10 Essential (primary) hypertension: Secondary | ICD-10-CM | POA: Diagnosis not present

## 2021-05-03 DIAGNOSIS — E7800 Pure hypercholesterolemia, unspecified: Secondary | ICD-10-CM | POA: Diagnosis not present

## 2021-05-10 DIAGNOSIS — E1122 Type 2 diabetes mellitus with diabetic chronic kidney disease: Secondary | ICD-10-CM | POA: Diagnosis not present

## 2021-05-10 DIAGNOSIS — E7849 Other hyperlipidemia: Secondary | ICD-10-CM | POA: Diagnosis not present

## 2021-05-10 DIAGNOSIS — I129 Hypertensive chronic kidney disease with stage 1 through stage 4 chronic kidney disease, or unspecified chronic kidney disease: Secondary | ICD-10-CM | POA: Diagnosis not present

## 2021-05-10 DIAGNOSIS — N184 Chronic kidney disease, stage 4 (severe): Secondary | ICD-10-CM | POA: Diagnosis not present

## 2021-05-20 DIAGNOSIS — M25532 Pain in left wrist: Secondary | ICD-10-CM | POA: Diagnosis not present

## 2021-05-20 DIAGNOSIS — Z6836 Body mass index (BMI) 36.0-36.9, adult: Secondary | ICD-10-CM | POA: Diagnosis not present

## 2021-06-08 DIAGNOSIS — M109 Gout, unspecified: Secondary | ICD-10-CM | POA: Diagnosis not present

## 2021-06-08 DIAGNOSIS — N184 Chronic kidney disease, stage 4 (severe): Secondary | ICD-10-CM | POA: Diagnosis not present

## 2021-06-08 DIAGNOSIS — E1165 Type 2 diabetes mellitus with hyperglycemia: Secondary | ICD-10-CM | POA: Diagnosis not present

## 2021-06-10 DIAGNOSIS — E7849 Other hyperlipidemia: Secondary | ICD-10-CM | POA: Diagnosis not present

## 2021-06-10 DIAGNOSIS — N184 Chronic kidney disease, stage 4 (severe): Secondary | ICD-10-CM | POA: Diagnosis not present

## 2021-06-10 DIAGNOSIS — E1122 Type 2 diabetes mellitus with diabetic chronic kidney disease: Secondary | ICD-10-CM | POA: Diagnosis not present

## 2021-06-10 DIAGNOSIS — I129 Hypertensive chronic kidney disease with stage 1 through stage 4 chronic kidney disease, or unspecified chronic kidney disease: Secondary | ICD-10-CM | POA: Diagnosis not present

## 2021-06-18 DIAGNOSIS — M109 Gout, unspecified: Secondary | ICD-10-CM | POA: Diagnosis not present

## 2021-06-18 DIAGNOSIS — N184 Chronic kidney disease, stage 4 (severe): Secondary | ICD-10-CM | POA: Diagnosis not present

## 2021-06-18 DIAGNOSIS — E1165 Type 2 diabetes mellitus with hyperglycemia: Secondary | ICD-10-CM | POA: Diagnosis not present

## 2021-06-21 DIAGNOSIS — E1165 Type 2 diabetes mellitus with hyperglycemia: Secondary | ICD-10-CM | POA: Diagnosis not present

## 2021-06-21 DIAGNOSIS — Z9114 Patient's other noncompliance with medication regimen: Secondary | ICD-10-CM | POA: Diagnosis not present

## 2021-06-21 DIAGNOSIS — E039 Hypothyroidism, unspecified: Secondary | ICD-10-CM | POA: Diagnosis not present

## 2021-06-21 DIAGNOSIS — E7849 Other hyperlipidemia: Secondary | ICD-10-CM | POA: Diagnosis not present

## 2021-06-21 DIAGNOSIS — J4520 Mild intermittent asthma, uncomplicated: Secondary | ICD-10-CM | POA: Diagnosis not present

## 2021-06-21 DIAGNOSIS — F324 Major depressive disorder, single episode, in partial remission: Secondary | ICD-10-CM | POA: Diagnosis not present

## 2021-06-21 DIAGNOSIS — M109 Gout, unspecified: Secondary | ICD-10-CM | POA: Diagnosis not present

## 2021-06-21 DIAGNOSIS — N184 Chronic kidney disease, stage 4 (severe): Secondary | ICD-10-CM | POA: Diagnosis not present

## 2021-06-21 DIAGNOSIS — R079 Chest pain, unspecified: Secondary | ICD-10-CM | POA: Diagnosis not present

## 2021-06-21 DIAGNOSIS — I10 Essential (primary) hypertension: Secondary | ICD-10-CM | POA: Diagnosis not present

## 2021-06-21 DIAGNOSIS — E782 Mixed hyperlipidemia: Secondary | ICD-10-CM | POA: Diagnosis not present

## 2021-06-29 DIAGNOSIS — F324 Major depressive disorder, single episode, in partial remission: Secondary | ICD-10-CM | POA: Diagnosis not present

## 2021-06-29 DIAGNOSIS — M109 Gout, unspecified: Secondary | ICD-10-CM | POA: Diagnosis not present

## 2021-06-29 DIAGNOSIS — Z9114 Patient's other noncompliance with medication regimen: Secondary | ICD-10-CM | POA: Diagnosis not present

## 2021-06-29 DIAGNOSIS — Z720 Tobacco use: Secondary | ICD-10-CM | POA: Diagnosis not present

## 2021-06-29 DIAGNOSIS — K219 Gastro-esophageal reflux disease without esophagitis: Secondary | ICD-10-CM | POA: Diagnosis not present

## 2021-06-29 DIAGNOSIS — E039 Hypothyroidism, unspecified: Secondary | ICD-10-CM | POA: Diagnosis not present

## 2021-06-29 DIAGNOSIS — N184 Chronic kidney disease, stage 4 (severe): Secondary | ICD-10-CM | POA: Diagnosis not present

## 2021-06-29 DIAGNOSIS — E1165 Type 2 diabetes mellitus with hyperglycemia: Secondary | ICD-10-CM | POA: Diagnosis not present

## 2021-07-07 DIAGNOSIS — E1165 Type 2 diabetes mellitus with hyperglycemia: Secondary | ICD-10-CM | POA: Diagnosis not present

## 2021-07-07 DIAGNOSIS — M109 Gout, unspecified: Secondary | ICD-10-CM | POA: Diagnosis not present

## 2021-07-07 DIAGNOSIS — N184 Chronic kidney disease, stage 4 (severe): Secondary | ICD-10-CM | POA: Diagnosis not present

## 2021-07-14 DIAGNOSIS — S20212A Contusion of left front wall of thorax, initial encounter: Secondary | ICD-10-CM | POA: Diagnosis not present

## 2021-07-14 DIAGNOSIS — N289 Disorder of kidney and ureter, unspecified: Secondary | ICD-10-CM | POA: Diagnosis not present

## 2021-07-14 DIAGNOSIS — R079 Chest pain, unspecified: Secondary | ICD-10-CM | POA: Diagnosis not present

## 2021-07-14 DIAGNOSIS — E079 Disorder of thyroid, unspecified: Secondary | ICD-10-CM | POA: Diagnosis not present

## 2021-07-14 DIAGNOSIS — R55 Syncope and collapse: Secondary | ICD-10-CM | POA: Diagnosis not present

## 2021-07-14 DIAGNOSIS — Z882 Allergy status to sulfonamides status: Secondary | ICD-10-CM | POA: Diagnosis not present

## 2021-07-14 DIAGNOSIS — S2020XA Contusion of thorax, unspecified, initial encounter: Secondary | ICD-10-CM | POA: Diagnosis not present

## 2021-07-14 DIAGNOSIS — E119 Type 2 diabetes mellitus without complications: Secondary | ICD-10-CM | POA: Diagnosis not present

## 2021-07-14 DIAGNOSIS — X58XXXA Exposure to other specified factors, initial encounter: Secondary | ICD-10-CM | POA: Diagnosis not present

## 2021-07-14 DIAGNOSIS — M109 Gout, unspecified: Secondary | ICD-10-CM | POA: Diagnosis not present

## 2021-07-14 DIAGNOSIS — Z79899 Other long term (current) drug therapy: Secondary | ICD-10-CM | POA: Diagnosis not present

## 2021-07-14 DIAGNOSIS — Z79890 Hormone replacement therapy (postmenopausal): Secondary | ICD-10-CM | POA: Diagnosis not present

## 2021-07-14 DIAGNOSIS — Z7984 Long term (current) use of oral hypoglycemic drugs: Secondary | ICD-10-CM | POA: Diagnosis not present

## 2021-08-03 DIAGNOSIS — M109 Gout, unspecified: Secondary | ICD-10-CM | POA: Diagnosis not present

## 2021-08-03 DIAGNOSIS — N183 Chronic kidney disease, stage 3 unspecified: Secondary | ICD-10-CM | POA: Diagnosis not present

## 2021-08-07 DIAGNOSIS — Z6837 Body mass index (BMI) 37.0-37.9, adult: Secondary | ICD-10-CM | POA: Diagnosis not present

## 2021-08-07 DIAGNOSIS — M109 Gout, unspecified: Secondary | ICD-10-CM | POA: Diagnosis not present

## 2021-08-07 DIAGNOSIS — E1165 Type 2 diabetes mellitus with hyperglycemia: Secondary | ICD-10-CM | POA: Diagnosis not present

## 2021-08-07 DIAGNOSIS — N184 Chronic kidney disease, stage 4 (severe): Secondary | ICD-10-CM | POA: Diagnosis not present

## 2021-08-11 DIAGNOSIS — Z6833 Body mass index (BMI) 33.0-33.9, adult: Secondary | ICD-10-CM | POA: Diagnosis not present

## 2021-08-11 DIAGNOSIS — K5900 Constipation, unspecified: Secondary | ICD-10-CM | POA: Diagnosis not present

## 2021-08-11 DIAGNOSIS — E119 Type 2 diabetes mellitus without complications: Secondary | ICD-10-CM | POA: Diagnosis not present

## 2021-08-11 DIAGNOSIS — E669 Obesity, unspecified: Secondary | ICD-10-CM | POA: Diagnosis not present

## 2021-08-11 DIAGNOSIS — I10 Essential (primary) hypertension: Secondary | ICD-10-CM | POA: Diagnosis not present

## 2021-08-11 DIAGNOSIS — K5641 Fecal impaction: Secondary | ICD-10-CM | POA: Diagnosis not present

## 2021-08-11 DIAGNOSIS — E079 Disorder of thyroid, unspecified: Secondary | ICD-10-CM | POA: Diagnosis not present

## 2021-08-11 DIAGNOSIS — E7800 Pure hypercholesterolemia, unspecified: Secondary | ICD-10-CM | POA: Diagnosis not present

## 2021-08-11 DIAGNOSIS — F32A Depression, unspecified: Secondary | ICD-10-CM | POA: Diagnosis not present

## 2021-08-11 DIAGNOSIS — F17200 Nicotine dependence, unspecified, uncomplicated: Secondary | ICD-10-CM | POA: Diagnosis not present

## 2021-08-12 DIAGNOSIS — M79641 Pain in right hand: Secondary | ICD-10-CM | POA: Diagnosis not present

## 2021-08-12 DIAGNOSIS — E669 Obesity, unspecified: Secondary | ICD-10-CM | POA: Diagnosis not present

## 2021-08-12 DIAGNOSIS — M1A09X0 Idiopathic chronic gout, multiple sites, without tophus (tophi): Secondary | ICD-10-CM | POA: Diagnosis not present

## 2021-08-12 DIAGNOSIS — Z6837 Body mass index (BMI) 37.0-37.9, adult: Secondary | ICD-10-CM | POA: Diagnosis not present

## 2021-08-17 ENCOUNTER — Encounter: Payer: Self-pay | Admitting: Internal Medicine

## 2021-09-10 DIAGNOSIS — N184 Chronic kidney disease, stage 4 (severe): Secondary | ICD-10-CM | POA: Diagnosis not present

## 2021-09-10 DIAGNOSIS — E7849 Other hyperlipidemia: Secondary | ICD-10-CM | POA: Diagnosis not present

## 2021-09-10 DIAGNOSIS — I129 Hypertensive chronic kidney disease with stage 1 through stage 4 chronic kidney disease, or unspecified chronic kidney disease: Secondary | ICD-10-CM | POA: Diagnosis not present

## 2021-09-10 DIAGNOSIS — E1122 Type 2 diabetes mellitus with diabetic chronic kidney disease: Secondary | ICD-10-CM | POA: Diagnosis not present

## 2021-09-16 DIAGNOSIS — M1A09X0 Idiopathic chronic gout, multiple sites, without tophus (tophi): Secondary | ICD-10-CM | POA: Diagnosis not present

## 2021-09-16 DIAGNOSIS — M79641 Pain in right hand: Secondary | ICD-10-CM | POA: Diagnosis not present

## 2021-10-20 DIAGNOSIS — E079 Disorder of thyroid, unspecified: Secondary | ICD-10-CM | POA: Diagnosis not present

## 2021-10-20 DIAGNOSIS — M109 Gout, unspecified: Secondary | ICD-10-CM | POA: Diagnosis not present

## 2021-10-20 DIAGNOSIS — I10 Essential (primary) hypertension: Secondary | ICD-10-CM | POA: Diagnosis not present

## 2021-10-20 DIAGNOSIS — R9431 Abnormal electrocardiogram [ECG] [EKG]: Secondary | ICD-10-CM | POA: Diagnosis not present

## 2021-10-20 DIAGNOSIS — R000 Tachycardia, unspecified: Secondary | ICD-10-CM | POA: Diagnosis not present

## 2021-10-20 DIAGNOSIS — R079 Chest pain, unspecified: Secondary | ICD-10-CM | POA: Diagnosis not present

## 2021-10-20 DIAGNOSIS — E119 Type 2 diabetes mellitus without complications: Secondary | ICD-10-CM | POA: Diagnosis not present

## 2021-10-20 DIAGNOSIS — N289 Disorder of kidney and ureter, unspecified: Secondary | ICD-10-CM | POA: Diagnosis not present

## 2021-10-20 DIAGNOSIS — E7800 Pure hypercholesterolemia, unspecified: Secondary | ICD-10-CM | POA: Diagnosis not present

## 2021-10-29 DIAGNOSIS — E119 Type 2 diabetes mellitus without complications: Secondary | ICD-10-CM | POA: Diagnosis not present

## 2021-10-29 DIAGNOSIS — R0602 Shortness of breath: Secondary | ICD-10-CM | POA: Diagnosis not present

## 2021-10-29 DIAGNOSIS — R072 Precordial pain: Secondary | ICD-10-CM | POA: Diagnosis not present

## 2021-10-29 DIAGNOSIS — I10 Essential (primary) hypertension: Secondary | ICD-10-CM | POA: Diagnosis not present

## 2021-10-29 DIAGNOSIS — I214 Non-ST elevation (NSTEMI) myocardial infarction: Secondary | ICD-10-CM | POA: Diagnosis not present

## 2021-10-29 DIAGNOSIS — Z20822 Contact with and (suspected) exposure to covid-19: Secondary | ICD-10-CM | POA: Diagnosis not present

## 2021-10-29 DIAGNOSIS — R0600 Dyspnea, unspecified: Secondary | ICD-10-CM | POA: Diagnosis not present

## 2021-10-29 DIAGNOSIS — R911 Solitary pulmonary nodule: Secondary | ICD-10-CM | POA: Diagnosis not present

## 2021-10-29 DIAGNOSIS — Z720 Tobacco use: Secondary | ICD-10-CM | POA: Diagnosis not present

## 2021-10-29 DIAGNOSIS — R000 Tachycardia, unspecified: Secondary | ICD-10-CM | POA: Diagnosis not present

## 2021-10-29 DIAGNOSIS — R778 Other specified abnormalities of plasma proteins: Secondary | ICD-10-CM | POA: Diagnosis not present

## 2021-10-29 DIAGNOSIS — R079 Chest pain, unspecified: Secondary | ICD-10-CM | POA: Diagnosis not present

## 2021-10-29 DIAGNOSIS — E7800 Pure hypercholesterolemia, unspecified: Secondary | ICD-10-CM | POA: Diagnosis not present

## 2021-10-30 DIAGNOSIS — R9439 Abnormal result of other cardiovascular function study: Secondary | ICD-10-CM | POA: Diagnosis not present

## 2021-10-30 DIAGNOSIS — R0789 Other chest pain: Secondary | ICD-10-CM | POA: Diagnosis not present

## 2021-10-30 DIAGNOSIS — I10 Essential (primary) hypertension: Secondary | ICD-10-CM | POA: Diagnosis not present

## 2021-10-30 DIAGNOSIS — I1310 Hypertensive heart and chronic kidney disease without heart failure, with stage 1 through stage 4 chronic kidney disease, or unspecified chronic kidney disease: Secondary | ICD-10-CM | POA: Diagnosis not present

## 2021-10-30 DIAGNOSIS — Z9114 Patient's other noncompliance with medication regimen: Secondary | ICD-10-CM | POA: Diagnosis not present

## 2021-10-30 DIAGNOSIS — R739 Hyperglycemia, unspecified: Secondary | ICD-10-CM | POA: Diagnosis not present

## 2021-10-30 DIAGNOSIS — F17210 Nicotine dependence, cigarettes, uncomplicated: Secondary | ICD-10-CM | POA: Diagnosis not present

## 2021-10-30 DIAGNOSIS — I214 Non-ST elevation (NSTEMI) myocardial infarction: Secondary | ICD-10-CM | POA: Diagnosis not present

## 2021-10-30 DIAGNOSIS — R079 Chest pain, unspecified: Secondary | ICD-10-CM | POA: Diagnosis not present

## 2021-10-30 DIAGNOSIS — E872 Acidosis, unspecified: Secondary | ICD-10-CM | POA: Diagnosis not present

## 2021-10-30 DIAGNOSIS — K219 Gastro-esophageal reflux disease without esophagitis: Secondary | ICD-10-CM | POA: Diagnosis not present

## 2021-10-30 DIAGNOSIS — Z720 Tobacco use: Secondary | ICD-10-CM | POA: Diagnosis not present

## 2021-10-30 DIAGNOSIS — E669 Obesity, unspecified: Secondary | ICD-10-CM | POA: Diagnosis not present

## 2021-10-30 DIAGNOSIS — R778 Other specified abnormalities of plasma proteins: Secondary | ICD-10-CM | POA: Diagnosis not present

## 2021-10-31 DIAGNOSIS — Z9114 Patient's other noncompliance with medication regimen: Secondary | ICD-10-CM | POA: Diagnosis not present

## 2021-10-31 DIAGNOSIS — R778 Other specified abnormalities of plasma proteins: Secondary | ICD-10-CM | POA: Diagnosis not present

## 2021-10-31 DIAGNOSIS — R079 Chest pain, unspecified: Secondary | ICD-10-CM | POA: Diagnosis not present

## 2021-10-31 DIAGNOSIS — K219 Gastro-esophageal reflux disease without esophagitis: Secondary | ICD-10-CM | POA: Diagnosis not present

## 2021-10-31 DIAGNOSIS — E669 Obesity, unspecified: Secondary | ICD-10-CM | POA: Diagnosis not present

## 2021-10-31 DIAGNOSIS — R0789 Other chest pain: Secondary | ICD-10-CM | POA: Diagnosis not present

## 2021-10-31 DIAGNOSIS — I1310 Hypertensive heart and chronic kidney disease without heart failure, with stage 1 through stage 4 chronic kidney disease, or unspecified chronic kidney disease: Secondary | ICD-10-CM | POA: Diagnosis not present

## 2021-10-31 DIAGNOSIS — F17210 Nicotine dependence, cigarettes, uncomplicated: Secondary | ICD-10-CM | POA: Diagnosis not present

## 2021-10-31 DIAGNOSIS — E872 Acidosis, unspecified: Secondary | ICD-10-CM | POA: Diagnosis not present

## 2021-10-31 DIAGNOSIS — I517 Cardiomegaly: Secondary | ICD-10-CM | POA: Diagnosis not present

## 2021-10-31 DIAGNOSIS — R739 Hyperglycemia, unspecified: Secondary | ICD-10-CM | POA: Diagnosis not present

## 2021-11-01 DIAGNOSIS — I10 Essential (primary) hypertension: Secondary | ICD-10-CM | POA: Diagnosis not present

## 2021-11-01 DIAGNOSIS — K219 Gastro-esophageal reflux disease without esophagitis: Secondary | ICD-10-CM | POA: Diagnosis not present

## 2021-11-01 DIAGNOSIS — Z9114 Patient's other noncompliance with medication regimen: Secondary | ICD-10-CM | POA: Diagnosis not present

## 2021-11-01 DIAGNOSIS — R739 Hyperglycemia, unspecified: Secondary | ICD-10-CM | POA: Diagnosis not present

## 2021-11-01 DIAGNOSIS — E669 Obesity, unspecified: Secondary | ICD-10-CM | POA: Diagnosis not present

## 2021-11-01 DIAGNOSIS — R778 Other specified abnormalities of plasma proteins: Secondary | ICD-10-CM | POA: Diagnosis not present

## 2021-11-01 DIAGNOSIS — Z720 Tobacco use: Secondary | ICD-10-CM | POA: Diagnosis not present

## 2021-11-01 DIAGNOSIS — R0789 Other chest pain: Secondary | ICD-10-CM | POA: Diagnosis not present

## 2021-11-01 DIAGNOSIS — E872 Acidosis, unspecified: Secondary | ICD-10-CM | POA: Diagnosis not present

## 2021-11-01 DIAGNOSIS — R079 Chest pain, unspecified: Secondary | ICD-10-CM | POA: Diagnosis not present

## 2021-11-01 DIAGNOSIS — I1310 Hypertensive heart and chronic kidney disease without heart failure, with stage 1 through stage 4 chronic kidney disease, or unspecified chronic kidney disease: Secondary | ICD-10-CM | POA: Diagnosis not present

## 2021-11-01 DIAGNOSIS — F17210 Nicotine dependence, cigarettes, uncomplicated: Secondary | ICD-10-CM | POA: Diagnosis not present

## 2021-11-02 DIAGNOSIS — R739 Hyperglycemia, unspecified: Secondary | ICD-10-CM | POA: Diagnosis not present

## 2021-11-02 DIAGNOSIS — R0789 Other chest pain: Secondary | ICD-10-CM | POA: Diagnosis not present

## 2021-11-02 DIAGNOSIS — R778 Other specified abnormalities of plasma proteins: Secondary | ICD-10-CM | POA: Diagnosis not present

## 2021-11-02 DIAGNOSIS — Z9114 Patient's other noncompliance with medication regimen: Secondary | ICD-10-CM | POA: Diagnosis not present

## 2021-11-02 DIAGNOSIS — F17210 Nicotine dependence, cigarettes, uncomplicated: Secondary | ICD-10-CM | POA: Diagnosis not present

## 2021-11-02 DIAGNOSIS — R079 Chest pain, unspecified: Secondary | ICD-10-CM | POA: Diagnosis not present

## 2021-11-02 DIAGNOSIS — I1310 Hypertensive heart and chronic kidney disease without heart failure, with stage 1 through stage 4 chronic kidney disease, or unspecified chronic kidney disease: Secondary | ICD-10-CM | POA: Diagnosis not present

## 2021-11-02 DIAGNOSIS — K219 Gastro-esophageal reflux disease without esophagitis: Secondary | ICD-10-CM | POA: Diagnosis not present

## 2021-11-02 DIAGNOSIS — Z720 Tobacco use: Secondary | ICD-10-CM | POA: Diagnosis not present

## 2021-11-02 DIAGNOSIS — E872 Acidosis, unspecified: Secondary | ICD-10-CM | POA: Diagnosis not present

## 2021-11-02 DIAGNOSIS — I10 Essential (primary) hypertension: Secondary | ICD-10-CM | POA: Diagnosis not present

## 2021-11-02 DIAGNOSIS — E669 Obesity, unspecified: Secondary | ICD-10-CM | POA: Diagnosis not present

## 2021-11-10 DIAGNOSIS — N184 Chronic kidney disease, stage 4 (severe): Secondary | ICD-10-CM | POA: Diagnosis not present

## 2021-11-10 DIAGNOSIS — I129 Hypertensive chronic kidney disease with stage 1 through stage 4 chronic kidney disease, or unspecified chronic kidney disease: Secondary | ICD-10-CM | POA: Diagnosis not present

## 2021-11-10 DIAGNOSIS — E1122 Type 2 diabetes mellitus with diabetic chronic kidney disease: Secondary | ICD-10-CM | POA: Diagnosis not present

## 2021-11-10 DIAGNOSIS — E7849 Other hyperlipidemia: Secondary | ICD-10-CM | POA: Diagnosis not present

## 2021-11-18 DIAGNOSIS — E039 Hypothyroidism, unspecified: Secondary | ICD-10-CM | POA: Diagnosis not present

## 2021-11-18 DIAGNOSIS — Z9114 Patient's other noncompliance with medication regimen: Secondary | ICD-10-CM | POA: Diagnosis not present

## 2021-11-18 DIAGNOSIS — K219 Gastro-esophageal reflux disease without esophagitis: Secondary | ICD-10-CM | POA: Diagnosis not present

## 2021-11-18 DIAGNOSIS — Z720 Tobacco use: Secondary | ICD-10-CM | POA: Diagnosis not present

## 2021-11-18 DIAGNOSIS — F324 Major depressive disorder, single episode, in partial remission: Secondary | ICD-10-CM | POA: Diagnosis not present

## 2021-11-18 DIAGNOSIS — Z6836 Body mass index (BMI) 36.0-36.9, adult: Secondary | ICD-10-CM | POA: Diagnosis not present

## 2021-11-18 DIAGNOSIS — M109 Gout, unspecified: Secondary | ICD-10-CM | POA: Diagnosis not present

## 2021-11-18 DIAGNOSIS — N184 Chronic kidney disease, stage 4 (severe): Secondary | ICD-10-CM | POA: Diagnosis not present

## 2021-11-18 DIAGNOSIS — K7460 Unspecified cirrhosis of liver: Secondary | ICD-10-CM | POA: Diagnosis not present

## 2021-11-18 DIAGNOSIS — E1165 Type 2 diabetes mellitus with hyperglycemia: Secondary | ICD-10-CM | POA: Diagnosis not present

## 2021-12-21 ENCOUNTER — Ambulatory Visit: Payer: Medicare HMO | Admitting: Cardiology

## 2021-12-21 NOTE — Progress Notes (Deleted)
Clinical Summary Ms. Toure is a 36 y.o.female seen today as a new consult, referred by PA [facility] for the following medical problems.  1.Chest pain - 10/2021 ER visit with chest pain  10/2021 nuclear stress [facility]   10/2021 echo: LVEF 50-55%, grade I dd 10/2021 nuclear stress: no ischemia, small fixed defect apical segment of inferolateral wall    2.CKD - ?ESRD and HD Past Medical History:  Diagnosis Date   Anxiety    Chest pain    CKD (chronic kidney disease)    Diabetes mellitus without complication (H[facility])    Gout    Hyperlipidemia    Hypertension    Hypothyroidism    Major depressive disorder    Mild asthma    Paresthesia      Allergies  Allergen Reactions   Doxycycline    Other     HCTZ - DEHYDRATION    Prednisone    Sulfa Antibiotics      Current Outpatient Medications  Medication Sig Dispense Refill   amLODipine (NORVASC) 10 MG tablet Take 10 mg by mouth daily.     atorvastatin (LIPITOR) 10 MG tablet Take 10 mg by mouth daily.     cetirizine (ZYRTEC) 10 MG tablet Take 10 mg by mouth daily.     Dulaglutide (TRULICITY) 4.12 IN/8.6VE SOPN Inject into the skin once a week.     DULoxetine (CYMBALTA) 60 MG capsule Take 60 mg by mouth daily.     Febuxostat (ULORIC) 80 MG TABS Take by mouth.     labetalol (NORMODYNE) 200 MG tablet Take 200 mg by mouth daily.     LEVOTHYROXINE SODIUM PO Take 275 mcg by mouth.     LORazepam (ATIVAN) 1 MG tablet Take 1 mg by mouth every 8 (eight) hours.     metFORMIN (GLUCOPHAGE) 850 MG tablet Take 850 mg by mouth 2 (two) times daily with a meal.     traMADol (ULTRAM) 50 MG tablet Take by mouth every 6 (six) hours as needed.     No current facility-administered medications for this visit.     *** The histories are not reviewed yet. Please review them in the "History" navigator section and refresh this [place].   Allergies  Allergen Reactions   Doxycycline    Other     HCTZ - DEHYDRATION    Prednisone    Sulfa  Antibiotics       Family History  Problem Relation Age of Onset   Heart disease Mother    Diabetes Mother    Hypertension Mother    Breast cancer Father      Social History Ms. Sanda has no history on file for tobacco use. Ms. Libbey has no history on file for alcohol use.   Review of Systems CONSTITUTIONAL: No weight loss, fever, chills, weakness or fatigue.  HEENT: Eyes: No visual loss, blurred vision, double vision or yellow sclerae.No hearing loss, sneezing, congestion, runny nose or sore throat.  SKIN: No rash or itching.  CARDIOVASCULAR:  RESPIRATORY: No shortness of breath, cough or sputum.  GASTROINTESTINAL: No anorexia, nausea, vomiting or diarrhea. No abdominal pain or blood.  GENITOURINARY: No burning on urination, no polyuria NEUROLOGICAL: No headache, dizziness, syncope, paralysis, ataxia, numbness or tingling in the extremities. No change in bowel or bladder control.  MUSCULOSKELETAL: No muscle, back pain, joint pain or stiffness.  LYMPHATICS: No enlarged nodes. No history of splenectomy.  PSYCHIATRIC: No history of depression or anxiety.  ENDOCRINOLOGIC: No reports of sweating, cold  or heat intolerance. No polyuria or polydipsia.  Marland Kitchen   Physical Examination There were no vitals filed for this visit. There were no vitals filed for this visit.  Gen: resting comfortably, no acute distress HEENT: no scleral icterus, pupils equal round and reactive, no palptable cervical adenopathy,  CV Resp: Clear to auscultation bilaterally GI: abdomen is soft, non-tender, non-distended, normal bowel sounds, no hepatosplenomegaly MSK: extremities are warm, no edema.  Skin: warm, no rash Neuro:  no focal deficits Psych: appropriate affect   Diagnostic Studies  10/2021 nuclear stress [facility] 1. No reversible ischemia. Small fixed defect in the apical segment  of the inferolateral wall.   2. Normal left ventricular wall motion.   3. Left ventricular ejection fraction 43%    4. Non invasive risk stratification*: Intermediate (based on low  ejection fraction)   10/2021 echo [facility] Summary    1. Technically difficult study.    2. The left ventricle is normal in size with normal wall thickness.    3. The left ventricular systolic function is normal, LVEF is visually  estimated at 50-55%.    4. There is grade I diastolic dysfunction (impaired relaxation).    5. The right ventricle is normal in size, with normal systolic function.    10/2021 CT chest [facility] 1. No acute abnormality identified in the chest.  2. Three-vessel coronary atherosclerosis.  3. 3 mm right lower lobe lung nodule. No routine follow-up imaging  is recommended per Fleischner Society Guidelines.  These guidelines do not apply to immunocompromised patients and  patients with cancer. Follow up in patients with significant  comorbidities as clinically warranted. For lung cancer screening,  adhere to Lung-RADS guidelines. Reference: Radiology. 2017;      Assessment and Plan        Arnoldo Lenis, M.D., F.A.C.C.

## 2021-12-27 ENCOUNTER — Encounter: Payer: Self-pay | Admitting: Internal Medicine

## 2021-12-27 ENCOUNTER — Ambulatory Visit: Payer: Medicare HMO | Admitting: Gastroenterology

## 2021-12-31 DIAGNOSIS — N189 Chronic kidney disease, unspecified: Secondary | ICD-10-CM | POA: Diagnosis not present

## 2021-12-31 DIAGNOSIS — E039 Hypothyroidism, unspecified: Secondary | ICD-10-CM | POA: Diagnosis not present

## 2022-01-09 DIAGNOSIS — E7849 Other hyperlipidemia: Secondary | ICD-10-CM | POA: Diagnosis not present

## 2022-01-09 DIAGNOSIS — E1122 Type 2 diabetes mellitus with diabetic chronic kidney disease: Secondary | ICD-10-CM | POA: Diagnosis not present

## 2022-01-09 DIAGNOSIS — N184 Chronic kidney disease, stage 4 (severe): Secondary | ICD-10-CM | POA: Diagnosis not present

## 2022-01-09 DIAGNOSIS — I129 Hypertensive chronic kidney disease with stage 1 through stage 4 chronic kidney disease, or unspecified chronic kidney disease: Secondary | ICD-10-CM | POA: Diagnosis not present

## 2022-01-14 DIAGNOSIS — Z0001 Encounter for general adult medical examination with abnormal findings: Secondary | ICD-10-CM | POA: Diagnosis not present

## 2022-01-14 DIAGNOSIS — Z720 Tobacco use: Secondary | ICD-10-CM | POA: Diagnosis not present

## 2022-01-14 DIAGNOSIS — K219 Gastro-esophageal reflux disease without esophagitis: Secondary | ICD-10-CM | POA: Diagnosis not present

## 2022-01-14 DIAGNOSIS — N183 Chronic kidney disease, stage 3 unspecified: Secondary | ICD-10-CM | POA: Diagnosis not present

## 2022-01-14 DIAGNOSIS — Z9114 Patient's other noncompliance with medication regimen: Secondary | ICD-10-CM | POA: Diagnosis not present

## 2022-01-14 DIAGNOSIS — F324 Major depressive disorder, single episode, in partial remission: Secondary | ICD-10-CM | POA: Diagnosis not present

## 2022-01-14 DIAGNOSIS — E039 Hypothyroidism, unspecified: Secondary | ICD-10-CM | POA: Diagnosis not present

## 2022-01-14 DIAGNOSIS — E1165 Type 2 diabetes mellitus with hyperglycemia: Secondary | ICD-10-CM | POA: Diagnosis not present

## 2022-01-14 DIAGNOSIS — Z23 Encounter for immunization: Secondary | ICD-10-CM | POA: Diagnosis not present

## 2022-01-14 DIAGNOSIS — N184 Chronic kidney disease, stage 4 (severe): Secondary | ICD-10-CM | POA: Diagnosis not present

## 2022-01-14 DIAGNOSIS — E1122 Type 2 diabetes mellitus with diabetic chronic kidney disease: Secondary | ICD-10-CM | POA: Diagnosis not present

## 2022-01-14 DIAGNOSIS — M109 Gout, unspecified: Secondary | ICD-10-CM | POA: Diagnosis not present

## 2022-01-17 DIAGNOSIS — R809 Proteinuria, unspecified: Secondary | ICD-10-CM | POA: Diagnosis not present

## 2022-01-17 DIAGNOSIS — M109 Gout, unspecified: Secondary | ICD-10-CM | POA: Diagnosis not present

## 2022-01-17 DIAGNOSIS — R112 Nausea with vomiting, unspecified: Secondary | ICD-10-CM | POA: Diagnosis not present

## 2022-01-17 DIAGNOSIS — I129 Hypertensive chronic kidney disease with stage 1 through stage 4 chronic kidney disease, or unspecified chronic kidney disease: Secondary | ICD-10-CM | POA: Diagnosis not present

## 2022-01-17 DIAGNOSIS — N2581 Secondary hyperparathyroidism of renal origin: Secondary | ICD-10-CM | POA: Diagnosis not present

## 2022-01-17 DIAGNOSIS — N184 Chronic kidney disease, stage 4 (severe): Secondary | ICD-10-CM | POA: Diagnosis not present

## 2022-01-17 DIAGNOSIS — N390 Urinary tract infection, site not specified: Secondary | ICD-10-CM | POA: Diagnosis not present

## 2022-01-17 DIAGNOSIS — D631 Anemia in chronic kidney disease: Secondary | ICD-10-CM | POA: Diagnosis not present

## 2022-01-20 ENCOUNTER — Ambulatory Visit: Payer: Medicare HMO | Admitting: Cardiology

## 2022-02-02 DIAGNOSIS — E1165 Type 2 diabetes mellitus with hyperglycemia: Secondary | ICD-10-CM | POA: Diagnosis not present

## 2022-02-02 DIAGNOSIS — R42 Dizziness and giddiness: Secondary | ICD-10-CM | POA: Diagnosis not present

## 2022-02-02 DIAGNOSIS — K219 Gastro-esophageal reflux disease without esophagitis: Secondary | ICD-10-CM | POA: Diagnosis not present

## 2022-02-02 DIAGNOSIS — M109 Gout, unspecified: Secondary | ICD-10-CM | POA: Diagnosis not present

## 2022-02-02 DIAGNOSIS — E1122 Type 2 diabetes mellitus with diabetic chronic kidney disease: Secondary | ICD-10-CM | POA: Diagnosis not present

## 2022-02-02 DIAGNOSIS — E039 Hypothyroidism, unspecified: Secondary | ICD-10-CM | POA: Diagnosis not present

## 2022-02-02 DIAGNOSIS — Z6835 Body mass index (BMI) 35.0-35.9, adult: Secondary | ICD-10-CM | POA: Diagnosis not present

## 2022-02-02 DIAGNOSIS — N184 Chronic kidney disease, stage 4 (severe): Secondary | ICD-10-CM | POA: Diagnosis not present

## 2022-02-14 ENCOUNTER — Encounter: Payer: Self-pay | Admitting: Internal Medicine

## 2022-03-02 ENCOUNTER — Encounter: Payer: Self-pay | Admitting: Internal Medicine

## 2022-03-03 ENCOUNTER — Ambulatory Visit: Payer: Medicare HMO | Admitting: Internal Medicine

## 2022-03-03 NOTE — Progress Notes (Deleted)
Patient ID: Angela Baker, female   DOB: 13-Nov-1986, 36 y.o.   MRN: [id number] ? ?This visit occurred during the SARS-CoV-2 public health emergency.  Safety protocols were in place, including screening questions prior to the visit, additional usage of staff PPE, and extensive cleaning of exam room while observing appropriate contact time as indicated for disinfecting solutions.  ? ?HPI  ?Angela Baker is a 36 y.o.-year-old female, referred by her PCP, Dr. Quillian Quince, for management of hypothyroidism. ? ?Pt. has been dx with hypothyroidism in *** >> on Levothyroxine 275 mcg. ? ?She takes the thyroid hormone: ?- fasting ?- with water ?- separated by >30 min from b'fast  ?- no calcium, iron, PPIs, multivitamins  ? ?I reviewed pt's thyroid tests: ?10/30/2021: TSH 13.3 ?No results found for: TSH, FREET4, T3FREE ? ?Antithyroid antibodies: ?No results found for: THGAB ?No components found for: TPOAB ? ?Pt describes: ?- weight gain ?- fatigue ?- cold intolerance ?- depression ?- constipation ?- dry skin ?- hair loss ? ?Pt denies feeling nodules in neck, hoarseness, dysphagia/odynophagia, SOB with lying down. ? ?She has + FH of thyroid disorders in: ***. No FH of thyroid cancer.  ?No h/o radiation tx to head or neck. ?No recent use of iodine supplements. ? ?Pt. also has a history of type 2 diabetes, insulin-dependent, with CKD stage IV (GFR 25), managed by PCP. ? ?Reviewed latest HbA1c: ?10/30/2021: HbA1c 7.6% ? ?She is on: Metformin, Tyler Aas, Trulicity low dose. ? ?She has hyperlipidemia: ?10/30/2021: 179/322/42/73 ? ?She also has a history of increased lipase - 226 in 10/2021, gout, obesity. ? ?ROS: ?Constitutional: no weight gain/loss, no fatigue, no subjective hyperthermia/hypothermia ?Eyes: no blurry vision, no xerophthalmia ?ENT: no sore throat, no nodules palpated in throat, no dysphagia/odynophagia, no hoarseness ?Cardiovascular: no CP/SOB/palpitations/leg swelling ?Respiratory: no cough/SOB ?Gastrointestinal: no  N/V/D/C ?Musculoskeletal: no muscle/joint aches ?Skin: no rashes ?Neurological: no tremors/numbness/tingling/dizziness ?Psychiatric: no depression/anxiety ? ?PE: ?There were no vitals taken for this visit. ?Wt Readings from Last 3 Encounters:  ?No data found for Wt  ? ?Constitutional: overweight, in NAD ?Eyes: PERRLA, EOMI, no exophthalmos ?ENT: moist mucous membranes, no thyromegaly, no cervical lymphadenopathy ?Cardiovascular: RRR, No MRG ?Respiratory: CTA B ?Gastrointestinal: abdomen soft, NT, ND, BS+ ?Musculoskeletal: no deformities, strength intact in all 4 ?Skin: moist, warm, no rashes ?Neurological: no tremor with outstretched hands, DTR normal in all 4 ? ?ASSESSMENT: ?1. Hypothyroidism ? ? ?PLAN:  ?1. Patient with long-standing hypothyroidism, on levothyroxine therapy.  She is on a high dose of levothyroxine. ?- she appears euthyroid.  ?- she does not appear to have a goiter, thyroid nodules, or neck compression symptoms ?- We discussed about correct intake of levothyroxine, fasting, with water, separated by at least 30 minutes from breakfast, and separated by more than 4 hours from calcium, iron, multivitamins, acid reflux medications (PPIs). ?- will check thyroid tests today: TSH, free T4 ?- If labs today are abnormal, she will need to return in ~6 weeks for repeat labs ?- Otherwise, I will see her back in 4 months ? ?Philemon Kingdom, MD PhD ?[facility] ? ? ?

## 2022-03-10 ENCOUNTER — Ambulatory Visit: Payer: Medicare HMO | Admitting: Internal Medicine

## 2022-04-18 DIAGNOSIS — N184 Chronic kidney disease, stage 4 (severe): Secondary | ICD-10-CM | POA: Diagnosis not present

## 2022-04-18 DIAGNOSIS — I129 Hypertensive chronic kidney disease with stage 1 through stage 4 chronic kidney disease, or unspecified chronic kidney disease: Secondary | ICD-10-CM | POA: Diagnosis not present

## 2022-04-18 DIAGNOSIS — R809 Proteinuria, unspecified: Secondary | ICD-10-CM | POA: Diagnosis not present

## 2022-04-18 DIAGNOSIS — D631 Anemia in chronic kidney disease: Secondary | ICD-10-CM | POA: Diagnosis not present

## 2022-04-18 DIAGNOSIS — N186 End stage renal disease: Secondary | ICD-10-CM | POA: Diagnosis not present

## 2022-04-18 DIAGNOSIS — N2581 Secondary hyperparathyroidism of renal origin: Secondary | ICD-10-CM | POA: Diagnosis not present

## 2022-04-20 ENCOUNTER — Other Ambulatory Visit: Payer: Self-pay | Admitting: Nephrology

## 2022-04-20 DIAGNOSIS — N184 Chronic kidney disease, stage 4 (severe): Secondary | ICD-10-CM

## 2022-04-20 DIAGNOSIS — N2581 Secondary hyperparathyroidism of renal origin: Secondary | ICD-10-CM

## 2022-04-20 DIAGNOSIS — I10 Essential (primary) hypertension: Secondary | ICD-10-CM

## 2022-04-20 DIAGNOSIS — D631 Anemia in chronic kidney disease: Secondary | ICD-10-CM

## 2022-04-20 DIAGNOSIS — R809 Proteinuria, unspecified: Secondary | ICD-10-CM

## 2022-05-26 ENCOUNTER — Ambulatory Visit
Admission: RE | Admit: 2022-05-26 | Discharge: 2022-05-26 | Disposition: A | Discharge status: Home / Self Care | Payer: Medicare HMO | Source: Ambulatory Visit | Attending: Nephrology | Admitting: Nephrology

## 2022-05-26 DIAGNOSIS — N189 Chronic kidney disease, unspecified: Secondary | ICD-10-CM | POA: Diagnosis not present

## 2022-05-26 DIAGNOSIS — N2581 Secondary hyperparathyroidism of renal origin: Secondary | ICD-10-CM

## 2022-05-26 DIAGNOSIS — N2889 Other specified disorders of kidney and ureter: Secondary | ICD-10-CM | POA: Diagnosis not present

## 2022-05-26 DIAGNOSIS — N184 Chronic kidney disease, stage 4 (severe): Secondary | ICD-10-CM

## 2022-05-26 DIAGNOSIS — D631 Anemia in chronic kidney disease: Secondary | ICD-10-CM

## 2022-05-26 DIAGNOSIS — R809 Proteinuria, unspecified: Secondary | ICD-10-CM

## 2022-05-26 DIAGNOSIS — I10 Essential (primary) hypertension: Secondary | ICD-10-CM

## 2022-05-30 ENCOUNTER — Other Ambulatory Visit: Payer: Self-pay | Admitting: Nephrology

## 2022-05-30 ENCOUNTER — Other Ambulatory Visit (HOSPITAL_COMMUNITY): Payer: Self-pay | Admitting: Nephrology

## 2022-05-30 DIAGNOSIS — N281 Cyst of kidney, acquired: Secondary | ICD-10-CM

## 2022-05-31 DIAGNOSIS — N184 Chronic kidney disease, stage 4 (severe): Secondary | ICD-10-CM | POA: Diagnosis not present

## 2022-05-31 DIAGNOSIS — E039 Hypothyroidism, unspecified: Secondary | ICD-10-CM | POA: Diagnosis not present

## 2022-05-31 DIAGNOSIS — Z720 Tobacco use: Secondary | ICD-10-CM | POA: Diagnosis not present

## 2022-05-31 DIAGNOSIS — K219 Gastro-esophageal reflux disease without esophagitis: Secondary | ICD-10-CM | POA: Diagnosis not present

## 2022-05-31 DIAGNOSIS — F324 Major depressive disorder, single episode, in partial remission: Secondary | ICD-10-CM | POA: Diagnosis not present

## 2022-05-31 DIAGNOSIS — Z6836 Body mass index (BMI) 36.0-36.9, adult: Secondary | ICD-10-CM | POA: Diagnosis not present

## 2022-05-31 DIAGNOSIS — E1165 Type 2 diabetes mellitus with hyperglycemia: Secondary | ICD-10-CM | POA: Diagnosis not present

## 2022-05-31 DIAGNOSIS — R079 Chest pain, unspecified: Secondary | ICD-10-CM | POA: Diagnosis not present

## 2022-05-31 DIAGNOSIS — M109 Gout, unspecified: Secondary | ICD-10-CM | POA: Diagnosis not present

## 2022-05-31 DIAGNOSIS — N185 Chronic kidney disease, stage 5: Secondary | ICD-10-CM | POA: Diagnosis not present

## 2022-06-01 NOTE — Patient Outreach (Signed)
[place] [facility]) Care Management  06/01/2022  Angela Baker [birth year] [id number]   Received a faxed referral from PCP for care management services. Reviewed request and sent to upstream for follow up as PCP is an embedded practice.   [place] Management Assistant [phone]

## 2022-06-22 DIAGNOSIS — N189 Chronic kidney disease, unspecified: Secondary | ICD-10-CM | POA: Diagnosis not present

## 2022-06-22 DIAGNOSIS — E1122 Type 2 diabetes mellitus with diabetic chronic kidney disease: Secondary | ICD-10-CM | POA: Diagnosis not present

## 2022-06-22 DIAGNOSIS — N12 Tubulo-interstitial nephritis, not specified as acute or chronic: Secondary | ICD-10-CM | POA: Diagnosis not present

## 2022-06-22 DIAGNOSIS — R160 Hepatomegaly, not elsewhere classified: Secondary | ICD-10-CM | POA: Diagnosis not present

## 2022-06-22 DIAGNOSIS — I129 Hypertensive chronic kidney disease with stage 1 through stage 4 chronic kidney disease, or unspecified chronic kidney disease: Secondary | ICD-10-CM | POA: Diagnosis not present

## 2022-06-22 DIAGNOSIS — E669 Obesity, unspecified: Secondary | ICD-10-CM | POA: Diagnosis not present

## 2022-06-22 DIAGNOSIS — N200 Calculus of kidney: Secondary | ICD-10-CM | POA: Diagnosis not present

## 2022-06-22 DIAGNOSIS — N83202 Unspecified ovarian cyst, left side: Secondary | ICD-10-CM | POA: Diagnosis not present

## 2022-06-22 DIAGNOSIS — R1012 Left upper quadrant pain: Secondary | ICD-10-CM | POA: Diagnosis not present

## 2022-06-22 DIAGNOSIS — N10 Acute tubulo-interstitial nephritis: Secondary | ICD-10-CM | POA: Diagnosis not present

## 2022-06-22 DIAGNOSIS — N261 Atrophy of kidney (terminal): Secondary | ICD-10-CM | POA: Diagnosis not present

## 2022-06-24 ENCOUNTER — Ambulatory Visit (HOSPITAL_COMMUNITY)
Admission: RE | Admit: 2022-06-24 | Discharge: 2022-06-24 | Disposition: A | Discharge status: Home / Self Care | Payer: Medicare HMO | Source: Ambulatory Visit | Attending: Nephrology | Admitting: Nephrology

## 2022-06-24 DIAGNOSIS — K760 Fatty (change of) liver, not elsewhere classified: Secondary | ICD-10-CM | POA: Diagnosis not present

## 2022-06-24 DIAGNOSIS — R160 Hepatomegaly, not elsewhere classified: Secondary | ICD-10-CM | POA: Diagnosis not present

## 2022-06-24 DIAGNOSIS — N281 Cyst of kidney, acquired: Secondary | ICD-10-CM

## 2022-06-24 DIAGNOSIS — R161 Splenomegaly, not elsewhere classified: Secondary | ICD-10-CM | POA: Diagnosis not present

## 2022-06-24 MED ORDER — GADOBUTROL 1 MMOL/ML IV SOLN
10.0000 mL | Freq: Once | INTRAVENOUS | Status: AC | PRN
  Administered 2022-06-24: 10 mL via INTRAVENOUS

## 2022-07-03 DIAGNOSIS — E079 Disorder of thyroid, unspecified: Secondary | ICD-10-CM | POA: Diagnosis not present

## 2022-07-03 DIAGNOSIS — X501XXA Overexertion from prolonged static or awkward postures, initial encounter: Secondary | ICD-10-CM | POA: Diagnosis not present

## 2022-07-03 DIAGNOSIS — S46912A Strain of unspecified muscle, fascia and tendon at shoulder and upper arm level, left arm, initial encounter: Secondary | ICD-10-CM | POA: Diagnosis not present

## 2022-07-03 DIAGNOSIS — R300 Dysuria: Secondary | ICD-10-CM | POA: Diagnosis not present

## 2022-07-03 DIAGNOSIS — Z79899 Other long term (current) drug therapy: Secondary | ICD-10-CM | POA: Diagnosis not present

## 2022-07-03 DIAGNOSIS — E7800 Pure hypercholesterolemia, unspecified: Secondary | ICD-10-CM | POA: Diagnosis not present

## 2022-07-03 DIAGNOSIS — M25512 Pain in left shoulder: Secondary | ICD-10-CM | POA: Diagnosis not present

## 2022-07-03 DIAGNOSIS — Z79890 Hormone replacement therapy (postmenopausal): Secondary | ICD-10-CM | POA: Diagnosis not present

## 2022-07-03 DIAGNOSIS — I10 Essential (primary) hypertension: Secondary | ICD-10-CM | POA: Diagnosis not present

## 2022-07-03 DIAGNOSIS — E119 Type 2 diabetes mellitus without complications: Secondary | ICD-10-CM | POA: Diagnosis not present

## 2022-07-03 DIAGNOSIS — Z882 Allergy status to sulfonamides status: Secondary | ICD-10-CM | POA: Diagnosis not present

## 2022-07-07 DIAGNOSIS — R110 Nausea: Secondary | ICD-10-CM | POA: Diagnosis not present

## 2022-07-07 DIAGNOSIS — F419 Anxiety disorder, unspecified: Secondary | ICD-10-CM | POA: Diagnosis not present

## 2022-07-07 DIAGNOSIS — E1165 Type 2 diabetes mellitus with hyperglycemia: Secondary | ICD-10-CM | POA: Diagnosis not present

## 2022-07-07 DIAGNOSIS — K7460 Unspecified cirrhosis of liver: Secondary | ICD-10-CM | POA: Diagnosis not present

## 2022-07-07 DIAGNOSIS — I10 Essential (primary) hypertension: Secondary | ICD-10-CM | POA: Diagnosis not present

## 2022-07-07 DIAGNOSIS — N184 Chronic kidney disease, stage 4 (severe): Secondary | ICD-10-CM | POA: Diagnosis not present

## 2022-07-07 DIAGNOSIS — M109 Gout, unspecified: Secondary | ICD-10-CM | POA: Diagnosis not present

## 2022-07-07 DIAGNOSIS — N939 Abnormal uterine and vaginal bleeding, unspecified: Secondary | ICD-10-CM | POA: Diagnosis not present

## 2022-07-07 DIAGNOSIS — N83209 Unspecified ovarian cyst, unspecified side: Secondary | ICD-10-CM | POA: Diagnosis not present

## 2022-07-11 DIAGNOSIS — K219 Gastro-esophageal reflux disease without esophagitis: Secondary | ICD-10-CM | POA: Diagnosis not present

## 2022-07-11 DIAGNOSIS — M109 Gout, unspecified: Secondary | ICD-10-CM | POA: Diagnosis not present

## 2022-07-11 DIAGNOSIS — E039 Hypothyroidism, unspecified: Secondary | ICD-10-CM | POA: Diagnosis not present

## 2022-07-11 DIAGNOSIS — I10 Essential (primary) hypertension: Secondary | ICD-10-CM | POA: Diagnosis not present

## 2022-07-11 DIAGNOSIS — E1165 Type 2 diabetes mellitus with hyperglycemia: Secondary | ICD-10-CM | POA: Diagnosis not present

## 2022-07-11 DIAGNOSIS — R5383 Other fatigue: Secondary | ICD-10-CM | POA: Diagnosis not present

## 2022-07-11 DIAGNOSIS — N184 Chronic kidney disease, stage 4 (severe): Secondary | ICD-10-CM | POA: Diagnosis not present

## 2022-07-11 DIAGNOSIS — E7849 Other hyperlipidemia: Secondary | ICD-10-CM | POA: Diagnosis not present

## 2022-07-14 ENCOUNTER — Encounter: Payer: Self-pay | Admitting: *Deleted

## 2022-07-15 DIAGNOSIS — M25512 Pain in left shoulder: Secondary | ICD-10-CM | POA: Diagnosis not present

## 2022-07-15 DIAGNOSIS — R030 Elevated blood-pressure reading, without diagnosis of hypertension: Secondary | ICD-10-CM | POA: Diagnosis not present

## 2022-07-15 DIAGNOSIS — S46002A Unspecified injury of muscle(s) and tendon(s) of the rotator cuff of left shoulder, initial encounter: Secondary | ICD-10-CM | POA: Diagnosis not present

## 2022-07-19 ENCOUNTER — Encounter: Payer: Self-pay | Admitting: *Deleted

## 2022-07-19 ENCOUNTER — Encounter: Payer: Self-pay | Admitting: Orthopedic Surgery

## 2022-07-19 ENCOUNTER — Encounter: Payer: Self-pay | Admitting: Cardiology

## 2022-07-19 NOTE — Progress Notes (Deleted)
Cardiology Office Note  Date: 07/19/2022   ID: Jamil, Castillo February 07, 1986, MRN [id number]  PCP:  Caryl Bis, MD  Cardiologist:  None Electrophysiologist:  None   No chief complaint on file.   History of Present Illness: Angela Baker is a medically complex 36 y.o. female referred for cardiology consultation by Ms. McGee PA-C with [facility] for evaluation of chest pain.  I reviewed the available records.  She was evaluated at [facility] in [place] back in November 2022 having presented with recurring chest discomfort.  She underwent a Lexiscan Myoview at that time that showed no active ischemia with calculated LVEF 43% however echocardiography indicated LVEF 50% range.  Peak high-sensitivity troponin I level was 232.  She was ultimately transferred to [facility] on heparin infusion.  She was evaluated by cardiology with recommendation not to pursue a cardiac catheterization particularly in light of significant renal insufficiency and ultimately risk of contrast-induced nephropathy.  Medical therapy was recommended.  I see that her most recent creatinine was 3.26 in July.   Past Medical History:  Diagnosis Date   Anxiety    CKD (chronic kidney disease) stage 4, GFR 15-29 ml/min (HCC)    Depression    Gout    Hyperlipidemia    Hypertension    Hypothyroidism    Type 2 diabetes mellitus (HCC)     *** The histories are not reviewed yet. Please review them in the "History" navigator section and refresh this [place].  Current Outpatient Medications  Medication Sig Dispense Refill   amLODipine (NORVASC) 10 MG tablet Take 10 mg by mouth daily.     atorvastatin (LIPITOR) 10 MG tablet Take 10 mg by mouth daily.     cetirizine (ZYRTEC) 10 MG tablet Take 10 mg by mouth daily.     Dulaglutide (TRULICITY) 2.58 NI/7.7OE SOPN Inject into the skin once a week.     DULoxetine (CYMBALTA) 60 MG capsule Take 60 mg by mouth daily.     Febuxostat (ULORIC) 80 MG TABS Take by mouth.      labetalol (NORMODYNE) 200 MG tablet Take 200 mg by mouth daily.     LEVOTHYROXINE SODIUM PO Take 275 mcg by mouth.     LORazepam (ATIVAN) 1 MG tablet Take 1 mg by mouth every 8 (eight) hours.     metFORMIN (GLUCOPHAGE) 850 MG tablet Take 850 mg by mouth 2 (two) times daily with a meal.     traMADol (ULTRAM) 50 MG tablet Take by mouth every 6 (six) hours as needed.     No current facility-administered medications for this visit.   Allergies:  Doxycycline, Other, Prednisone, and Sulfa antibiotics   Social History: The patient  reports that she has quit smoking. Her smoking use included cigarettes. She has never used smokeless tobacco. She reports that she does not currently use alcohol. She reports that she does not currently use drugs after having used the following drugs: Marijuana.   Family History: The patient's family history includes Breast cancer in her mother; Heart disease in her father; Hypertension in her father and mother.   ROS:  Please see the history of present illness. Otherwise, complete review of systems is positive for {NONE DEFAULTED:18576}.  All other systems are reviewed and negative.   Physical Exam: VS:  There were no vitals taken for this visit., BMI There is no height or weight on file to calculate BMI.  Wt Readings from Last 3 Encounters:  No data found for [employer]  General: Patient appears comfortable at rest. HEENT: Conjunctiva and lids normal, oropharynx clear with moist mucosa. Neck: Supple, no elevated JVP or carotid bruits, no thyromegaly. Lungs: Clear to auscultation, nonlabored breathing at rest. Cardiac: Regular rate and rhythm, no S3 or significant systolic murmur, no pericardial rub. Abdomen: Soft, nontender, no hepatomegaly, bowel sounds present, no guarding or rebound. Extremities: No pitting edema, distal pulses 2+. Skin: Warm and dry. Musculoskeletal: No kyphosis. Neuropsychiatric: Alert and oriented x3, affect grossly appropriate.  ECG:  An  ECG dated 12/21/2020 was personally reviewed today and demonstrated:  Sinus rhythm with LVH.  Recent Labwork:  July 2023: Hemoglobin 11.6, platelets 94, potassium 3.2, BUN 20, creatinine 3.26, glucose 266, AST 18, ALT 21, urine pregnancy negative, urinalysis with greater than 300 mg/dL protein, blood and greater than 25 RBCs.  Other Studies Reviewed Today:  Lexiscan Myoview 10/29/2021 ([place]): 1. No reversible ischemia. Small fixed defect in the apical segment  of the inferolateral wall.   2. Normal left ventricular wall motion.   3. Left ventricular ejection fraction 43%   4. Non invasive risk stratification*: Intermediate (based on low  ejection fraction)   Echocardiogram 10/31/2021 ([facility]):  Left ventricle   Normal chamber size.  Mild concentric hypertrophy.  Normal wall motion  with low-normal LVEF (50%).  Abnormal diastolic function.      Right ventricle   Not well visualized but appears grossly normal size.  Doppler parameters  suggest normal systolic function.            Left atrium   Normal size.        Right atrium   Normal size.       Mitral valve   Normal structure with no regurgitation.           Tricuspid valve   Grossly normal structure with no regurgitation.           Aortic valve   Trileaflet. Pliable cusps with normal excursion.  No stenosis or  regurgitation.         Pulmonic valve   Grossly normal structure with no regurgitation.           Ascending aorta   Normal aortic root diameter.       Pericardium   No pericardial effusion.    Assessment and Plan:    Medication Adjustments/Labs and Tests Ordered: Current medicines are reviewed at length with the patient today.  Concerns regarding medicines are outlined above.   Tests Ordered: No orders of the defined types were placed in this encounter.   Medication Changes: No orders of the defined types were placed in this encounter.   Disposition:  Follow up {follow  up:15908}  Signed, Satira Sark, MD, [facility] 07/19/2022 7:59 PM    [facility] at [place], [place], [place] [address] Phone: [phone]; Fax: [phone]

## 2022-07-20 ENCOUNTER — Ambulatory Visit: Payer: Medicare HMO | Admitting: Cardiology

## 2022-07-20 DIAGNOSIS — Z87898 Personal history of other specified conditions: Secondary | ICD-10-CM

## 2022-07-20 DIAGNOSIS — N184 Chronic kidney disease, stage 4 (severe): Secondary | ICD-10-CM

## 2022-08-03 ENCOUNTER — Ambulatory Visit: Payer: Medicare HMO | Admitting: Cardiology

## 2022-08-03 ENCOUNTER — Encounter: Payer: Self-pay | Admitting: Cardiology

## 2022-08-03 DIAGNOSIS — R030 Elevated blood-pressure reading, without diagnosis of hypertension: Secondary | ICD-10-CM | POA: Diagnosis not present

## 2022-08-03 DIAGNOSIS — R1110 Vomiting, unspecified: Secondary | ICD-10-CM | POA: Diagnosis not present

## 2022-08-03 DIAGNOSIS — F419 Anxiety disorder, unspecified: Secondary | ICD-10-CM | POA: Diagnosis not present

## 2022-08-03 DIAGNOSIS — R202 Paresthesia of skin: Secondary | ICD-10-CM | POA: Diagnosis not present

## 2022-08-03 DIAGNOSIS — Z6835 Body mass index (BMI) 35.0-35.9, adult: Secondary | ICD-10-CM | POA: Diagnosis not present

## 2022-08-03 DIAGNOSIS — N184 Chronic kidney disease, stage 4 (severe): Secondary | ICD-10-CM | POA: Diagnosis not present

## 2022-08-03 DIAGNOSIS — E1165 Type 2 diabetes mellitus with hyperglycemia: Secondary | ICD-10-CM | POA: Diagnosis not present

## 2022-08-03 DIAGNOSIS — M79604 Pain in right leg: Secondary | ICD-10-CM | POA: Diagnosis not present

## 2022-08-03 NOTE — Progress Notes (Deleted)
Cardiology Office Note  Date: 08/03/2022   ID: Srah, Ake 1986/11/18, MRN [id number]  PCP:  Caryl Bis, MD  Cardiologist:  None Electrophysiologist:  None   No chief complaint on file.   History of Present Illness: Angela Baker is a 36 y.o. female referred for cardiology consultation by Ms. Skillman PA-C with [facility] for the evaluation of chest discomfort and shortness of breath.  I reviewed the available records and initial referral from June.  She was evaluated at [facility] in [place] back in November 2022 having presented with recurring chest discomfort.  She underwent a Lexiscan Myoview at that time that showed no active ischemia with calculated LVEF 43% however echocardiography indicated LVEF 50% range.  Peak high-sensitivity troponin I level was 232.  She was ultimately transferred to [facility] on heparin infusion.  She was evaluated by cardiology with recommendation not to pursue a cardiac catheterization particularly in light of significant renal insufficiency and ultimately risk of contrast-induced nephropathy.  Medical therapy was recommended.  I see that her most recent creatinine was 3.26 in July.  Past Medical History:  Diagnosis Date   Anxiety    CKD (chronic kidney disease) stage 4, GFR 15-29 ml/min (HCC)    Depression    Gout    Hyperlipidemia    Hypertension    Hypothyroidism    Type 2 diabetes mellitus (HCC)     *** The histories are not reviewed yet. Please review them in the "History" navigator section and refresh this [place].  Current Outpatient Medications  Medication Sig Dispense Refill   amLODipine (NORVASC) 10 MG tablet Take 10 mg by mouth daily.     atorvastatin (LIPITOR) 10 MG tablet Take 10 mg by mouth daily.     cetirizine (ZYRTEC) 10 MG tablet Take 10 mg by mouth daily.     Dulaglutide (TRULICITY) 5.63 JS/9.7WY SOPN Inject into the skin once a week.     DULoxetine (CYMBALTA) 60 MG capsule Take 60 mg by mouth daily.      Febuxostat (ULORIC) 80 MG TABS Take by mouth.     labetalol (NORMODYNE) 200 MG tablet Take 200 mg by mouth daily.     LEVOTHYROXINE SODIUM PO Take 275 mcg by mouth.     LORazepam (ATIVAN) 1 MG tablet Take 1 mg by mouth every 8 (eight) hours.     metFORMIN (GLUCOPHAGE) 850 MG tablet Take 850 mg by mouth 2 (two) times daily with a meal.     traMADol (ULTRAM) 50 MG tablet Take by mouth every 6 (six) hours as needed.     No current facility-administered medications for this visit.   Allergies:  Doxycycline, Other, Prednisone, and Sulfa antibiotics   Social History: The patient  reports that she has quit smoking. Her smoking use included cigarettes. She has never used smokeless tobacco. She reports that she does not currently use alcohol. She reports that she does not currently use drugs after having used the following drugs: Marijuana.   Family History: The patient's family history includes Breast cancer in her mother; Diabetes in her father; Heart disease in her father; Hypertension in her father and mother.   ROS:  Please see the history of present illness. Otherwise, complete review of systems is positive for {NONE DEFAULTED:18576}.  All other systems are reviewed and negative.   Physical Exam: VS:  There were no vitals taken for this visit., BMI There is no height or weight on file to calculate BMI.  Wt Readings  from Last 3 Encounters:  No data found for Wt    General: Patient appears comfortable at rest. HEENT: Conjunctiva and lids normal, oropharynx clear with moist mucosa. Neck: Supple, no elevated JVP or carotid bruits, no thyromegaly. Lungs: Clear to auscultation, nonlabored breathing at rest. Cardiac: Regular rate and rhythm, no S3 or significant systolic murmur, no pericardial rub. Abdomen: Soft, nontender, no hepatomegaly, bowel sounds present, no guarding or rebound. Extremities: No pitting edema, distal pulses 2+. Skin: Warm and dry. Musculoskeletal: No  kyphosis. Neuropsychiatric: Alert and oriented x3, affect grossly appropriate.  ECG:  An ECG dated 12/21/2020 was personally reviewed today and demonstrated:  Sinus rhythm with LVH.  Recent Labwork:  July 2023: Hemoglobin 11.6, platelets 94, potassium 3.2, BUN 20, creatinine 3.26, glucose 266, AST 18, ALT 21, urine pregnancy negative, urinalysis with greater than 300 mg/dL protein, blood and greater than 25 RBCs.  Other Studies Reviewed Today:  Lexiscan Myoview 10/29/2021 ([place]): 1. No reversible ischemia. Small fixed defect in the apical segment  of the inferolateral wall.   2. Normal left ventricular wall motion.   3. Left ventricular ejection fraction 43%   4. Non invasive risk stratification*: Intermediate (based on low  ejection fraction)   Echocardiogram 10/31/2021 ([facility]):  Left ventricle   Normal chamber size.  Mild concentric hypertrophy.  Normal wall motion  with low-normal LVEF (50%).  Abnormal diastolic function.      Right ventricle   Not well visualized but appears grossly normal size.  Doppler parameters  suggest normal systolic function.            Left atrium   Normal size.        Right atrium   Normal size.       Mitral valve   Normal structure with no regurgitation.           Tricuspid valve   Grossly normal structure with no regurgitation.           Aortic valve   Trileaflet. Pliable cusps with normal excursion.  No stenosis or  regurgitation.         Pulmonic valve   Grossly normal structure with no regurgitation.           Ascending aorta   Normal aortic root diameter.       Pericardium   No pericardial effusion.    Assessment and Plan:    Medication Adjustments/Labs and Tests Ordered: Current medicines are reviewed at length with the patient today.  Concerns regarding medicines are outlined above.   Tests Ordered: No orders of the defined types were placed in this encounter.   Medication Changes: No orders of the  defined types were placed in this encounter.   Disposition:  Follow up {follow up:15908}  Signed, Satira Sark, MD, [facility] 08/03/2022 8:42 AM    [facility] at [place], [place], [place] [address] Phone: [phone]; Fax: [phone]

## 2022-08-08 DIAGNOSIS — M25561 Pain in right knee: Secondary | ICD-10-CM | POA: Diagnosis not present

## 2022-08-08 DIAGNOSIS — N184 Chronic kidney disease, stage 4 (severe): Secondary | ICD-10-CM | POA: Diagnosis not present

## 2022-08-08 DIAGNOSIS — M109 Gout, unspecified: Secondary | ICD-10-CM | POA: Diagnosis not present

## 2022-08-08 DIAGNOSIS — E559 Vitamin D deficiency, unspecified: Secondary | ICD-10-CM | POA: Diagnosis not present

## 2022-08-08 DIAGNOSIS — Z6835 Body mass index (BMI) 35.0-35.9, adult: Secondary | ICD-10-CM | POA: Diagnosis not present

## 2022-08-08 DIAGNOSIS — M25552 Pain in left hip: Secondary | ICD-10-CM | POA: Diagnosis not present

## 2022-08-08 DIAGNOSIS — E538 Deficiency of other specified B group vitamins: Secondary | ICD-10-CM | POA: Diagnosis not present

## 2022-08-08 DIAGNOSIS — R030 Elevated blood-pressure reading, without diagnosis of hypertension: Secondary | ICD-10-CM | POA: Diagnosis not present

## 2022-08-08 DIAGNOSIS — E1165 Type 2 diabetes mellitus with hyperglycemia: Secondary | ICD-10-CM | POA: Diagnosis not present

## 2022-08-17 ENCOUNTER — Ambulatory Visit: Payer: Medicare HMO | Admitting: Internal Medicine

## 2022-08-18 DIAGNOSIS — E1165 Type 2 diabetes mellitus with hyperglycemia: Secondary | ICD-10-CM | POA: Diagnosis not present

## 2022-08-18 DIAGNOSIS — R110 Nausea: Secondary | ICD-10-CM | POA: Diagnosis not present

## 2022-08-18 DIAGNOSIS — N184 Chronic kidney disease, stage 4 (severe): Secondary | ICD-10-CM | POA: Diagnosis not present

## 2022-08-18 DIAGNOSIS — K7460 Unspecified cirrhosis of liver: Secondary | ICD-10-CM | POA: Diagnosis not present

## 2022-08-18 DIAGNOSIS — M109 Gout, unspecified: Secondary | ICD-10-CM | POA: Diagnosis not present

## 2022-08-18 DIAGNOSIS — I10 Essential (primary) hypertension: Secondary | ICD-10-CM | POA: Diagnosis not present

## 2022-08-18 DIAGNOSIS — N939 Abnormal uterine and vaginal bleeding, unspecified: Secondary | ICD-10-CM | POA: Diagnosis not present

## 2022-08-18 DIAGNOSIS — F419 Anxiety disorder, unspecified: Secondary | ICD-10-CM | POA: Diagnosis not present

## 2022-08-18 DIAGNOSIS — N83209 Unspecified ovarian cyst, unspecified side: Secondary | ICD-10-CM | POA: Diagnosis not present

## 2022-08-19 ENCOUNTER — Encounter: Payer: Self-pay | Admitting: Internal Medicine

## 2022-08-22 DIAGNOSIS — N2581 Secondary hyperparathyroidism of renal origin: Secondary | ICD-10-CM | POA: Diagnosis not present

## 2022-08-22 DIAGNOSIS — N281 Cyst of kidney, acquired: Secondary | ICD-10-CM | POA: Diagnosis not present

## 2022-08-22 DIAGNOSIS — R809 Proteinuria, unspecified: Secondary | ICD-10-CM | POA: Diagnosis not present

## 2022-08-22 DIAGNOSIS — I129 Hypertensive chronic kidney disease with stage 1 through stage 4 chronic kidney disease, or unspecified chronic kidney disease: Secondary | ICD-10-CM | POA: Diagnosis not present

## 2022-08-22 DIAGNOSIS — N184 Chronic kidney disease, stage 4 (severe): Secondary | ICD-10-CM | POA: Diagnosis not present

## 2022-08-22 DIAGNOSIS — D631 Anemia in chronic kidney disease: Secondary | ICD-10-CM | POA: Diagnosis not present

## 2022-08-22 DIAGNOSIS — E872 Acidosis, unspecified: Secondary | ICD-10-CM | POA: Diagnosis not present

## 2022-09-05 ENCOUNTER — Ambulatory Visit: Payer: Medicare HMO | Admitting: Gastroenterology

## 2022-09-05 NOTE — Progress Notes (Deleted)
GI Office Note    Referring Provider: Caryl Bis, MD Primary Care Physician:  Caryl Bis, MD  Primary Gastroenterologist:   Chief Complaint   No chief complaint on file.    History of Present Illness   Angela Baker is a 36 y.o. female presenting today at the request of Caryl Bis, MD for ***      Current Outpatient Medications  Medication Sig Dispense Refill   amLODipine (NORVASC) 10 MG tablet Take 10 mg by mouth daily.     atorvastatin (LIPITOR) 10 MG tablet Take 10 mg by mouth daily.     cetirizine (ZYRTEC) 10 MG tablet Take 10 mg by mouth daily.     Dulaglutide (TRULICITY) 9.81 XB/1.4NW SOPN Inject into the skin once a week.     DULoxetine (CYMBALTA) 60 MG capsule Take 60 mg by mouth daily.     Febuxostat (ULORIC) 80 MG TABS Take by mouth.     labetalol (NORMODYNE) 200 MG tablet Take 200 mg by mouth daily.     LEVOTHYROXINE SODIUM PO Take 275 mcg by mouth.     LORazepam (ATIVAN) 1 MG tablet Take 1 mg by mouth every 8 (eight) hours.     metFORMIN (GLUCOPHAGE) 850 MG tablet Take 850 mg by mouth 2 (two) times daily with a meal.     traMADol (ULTRAM) 50 MG tablet Take by mouth every 6 (six) hours as needed.     No current facility-administered medications for this visit.    Past Medical History:  Diagnosis Date   Anxiety    CKD (chronic kidney disease) stage 4, GFR 15-29 ml/min (HCC)    Depression    Gout    Hyperlipidemia    Hypertension    Hypothyroidism    Type 2 diabetes mellitus (HCC)     *** The histories are not reviewed yet. Please review them in the "History" navigator section and refresh this [place].  Family History  Problem Relation Age of Onset   Breast cancer Mother    Hypertension Mother    Diabetes Father    Hypertension Father    Heart disease Father     Allergies as of 09/05/2022 - never reviewed  Allergen Reaction Noted   Doxycycline  02/02/2021   Other  02/02/2021   Prednisone  02/02/2021   Sulfa antibiotics   02/02/2021    Social History   Socioeconomic History   Marital status: Single    Spouse name: Not on file   Number of children: Not on file   Years of education: Not on file   Highest education level: Not on file  Occupational History   Not on file  Tobacco Use   Smoking status: Former    Types: Cigarettes   Smokeless tobacco: Never  Substance and Sexual Activity   Alcohol use: Not Currently   Drug use: Not Currently    Types: Marijuana   Sexual activity: Not on file  Other Topics Concern   Not on file  Social History Narrative   Not on file   Social Determinants of Health   Financial Resource Strain: Not on file  Food Insecurity: Not on file  Transportation Needs: Not on file  Physical Activity: Not on file  Stress: Not on file  Social Connections: Not on file  Intimate Partner Violence: Not on file     Review of Systems   Gen: Denies any fever, chills, fatigue, weight loss, lack of appetite.  CV: Denies chest pain,  heart palpitations, peripheral edema, syncope.  Resp: Denies shortness of breath at rest or with exertion. Denies wheezing or cough.  GI: see HPI GU : Denies urinary burning, urinary frequency, urinary hesitancy MS: Denies joint pain, muscle weakness, cramps, or limitation of movement.  Derm: Denies rash, itching, dry skin Psych: Denies depression, anxiety, memory loss, and confusion Heme: Denies bruising, bleeding, and enlarged lymph nodes.   Physical Exam   There were no vitals taken for this visit.  General:   Alert and oriented. Pleasant and cooperative. Well-nourished and well-developed.  Head:  Normocephalic and atraumatic. Eyes:  Without icterus, sclera clear and conjunctiva pink.  Ears:  Normal auditory acuity. Mouth:  No deformity or lesions, oral mucosa pink.  Lungs:  Clear to auscultation bilaterally. No wheezes, rales, or rhonchi. No distress.  Heart:  S1, S2 present without murmurs appreciated.  Abdomen:  +BS, soft, non-tender  and non-distended. No HSM noted. No guarding or rebound. No masses appreciated.  Rectal:  Deferred  Msk:  Symmetrical without gross deformities. Normal posture. Extremities:  Without edema. Neurologic:  Alert and  oriented x4;  grossly normal neurologically. Skin:  Intact without significant lesions or rashes. Psych:  Alert and cooperative. Normal mood and affect.   Assessment   Angela Baker is a 36 y.o. female with a history of *** presenting today with      PLAN   ***    Venetia Night, MSN, FNP-BC, AGACNP-BC [facility]

## 2022-09-22 ENCOUNTER — Ambulatory Visit: Payer: Medicare HMO | Admitting: Gastroenterology

## 2022-09-22 NOTE — Progress Notes (Deleted)
GI Office Note    Referring Provider: Caryl Bis, MD Primary Care Physician:  Caryl Bis, MD  Primary Gastroenterologist: Elon Alas. Abbey Chatters, DO   Chief Complaint   No chief complaint on file.   History of Present Illness   Angela Baker is a 36 y.o. female presenting today at the request of Caryl Bis, MD for ***cirrhosis evaluation based on prior Ct imaging.   ED visit for left flank pain in July with CT A/P revealing no splenomegaly, bilateral atrophied kidneys and left nephrolithiasis without hydronephrosis, low-density retained stool throughout the distal large panel, nodular liver contour suspicious for cirrhosis with hepatomegaly without any discrete liver lesion.  Labs at that time with albumin 3.3, normal AST and ALT, elevated alk phos to 161.  Evidence of thrombocytopenia with platelets 94.  Creatinine was elevated at 3.26, potassium 3.2 and elevated glucose.  Referral placed by PCP who she seen at the end of July for hospital follow-up.  Also advised to continue Protonix 40 mg daily for GERD.  History of previously elevated lipase was to be rechecked.  No recent labs on file since July 2023.    Current Outpatient Medications  Medication Sig Dispense Refill   amLODipine (NORVASC) 10 MG tablet Take 10 mg by mouth daily.     atorvastatin (LIPITOR) 10 MG tablet Take 10 mg by mouth daily.     cetirizine (ZYRTEC) 10 MG tablet Take 10 mg by mouth daily.     Dulaglutide (TRULICITY) 8.00 LK/9.1PH SOPN Inject into the skin once a week.     DULoxetine (CYMBALTA) 60 MG capsule Take 60 mg by mouth daily.     Febuxostat (ULORIC) 80 MG TABS Take by mouth.     labetalol (NORMODYNE) 200 MG tablet Take 200 mg by mouth daily.     LEVOTHYROXINE SODIUM PO Take 275 mcg by mouth.     LORazepam (ATIVAN) 1 MG tablet Take 1 mg by mouth every 8 (eight) hours.     metFORMIN (GLUCOPHAGE) 850 MG tablet Take 850 mg by mouth 2 (two) times daily with a meal.     traMADol (ULTRAM) 50 MG  tablet Take by mouth every 6 (six) hours as needed.     No current facility-administered medications for this visit.    Past Medical History:  Diagnosis Date   Anxiety    CKD (chronic kidney disease) stage 4, GFR 15-29 ml/min (HCC)    Depression    Gout    Hyperlipidemia    Hypertension    Hypothyroidism    Type 2 diabetes mellitus (HCC)     *** The histories are not reviewed yet. Please review them in the "History" navigator section and refresh this [place].  Family History  Problem Relation Age of Onset   Breast cancer Mother    Hypertension Mother    Diabetes Father    Hypertension Father    Heart disease Father     Allergies as of 09/22/2022 - never reviewed  Allergen Reaction Noted   Doxycycline  02/02/2021   Other  02/02/2021   Prednisone  02/02/2021   Sulfa antibiotics  02/02/2021    Social History   Socioeconomic History   Marital status: Single    Spouse name: Not on file   Number of children: Not on file   Years of education: Not on file   Highest education level: Not on file  Occupational History   Not on file  Tobacco Use   Smoking status:  Former    Types: Cigarettes   Smokeless tobacco: Never  Substance and Sexual Activity   Alcohol use: Not Currently   Drug use: Not Currently    Types: Marijuana   Sexual activity: Not on file  Other Topics Concern   Not on file  Social History Narrative   Not on file   Social Determinants of Health   Financial Resource Strain: Not on file  Food Insecurity: Not on file  Transportation Needs: Not on file  Physical Activity: Not on file  Stress: Not on file  Social Connections: Not on file  Intimate Partner Violence: Not on file     Review of Systems   Gen: Denies any fever, chills, fatigue, weight loss, lack of appetite.  CV: Denies chest pain, heart palpitations, peripheral edema, syncope.  Resp: Denies shortness of breath at rest or with exertion. Denies wheezing or cough.  GI: see HPI GU :  Denies urinary burning, urinary frequency, urinary hesitancy MS: Denies joint pain, muscle weakness, cramps, or limitation of movement.  Derm: Denies rash, itching, dry skin Psych: Denies depression, anxiety, memory loss, and confusion Heme: Denies bruising, bleeding, and enlarged lymph nodes.   Physical Exam   There were no vitals taken for this visit.  General:   Alert and oriented. Pleasant and cooperative. Well-nourished and well-developed.  Head:  Normocephalic and atraumatic. Eyes:  Without icterus, sclera clear and conjunctiva pink.  Ears:  Normal auditory acuity. Mouth:  No deformity or lesions, oral mucosa pink.  Lungs:  Clear to auscultation bilaterally. No wheezes, rales, or rhonchi. No distress.  Heart:  S1, S2 present without murmurs appreciated.  Abdomen:  +BS, soft, non-tender and non-distended. No HSM noted. No guarding or rebound. No masses appreciated.  Rectal:  Deferred  Msk:  Symmetrical without gross deformities. Normal posture. Extremities:  Without edema. Neurologic:  Alert and  oriented x4;  grossly normal neurologically. Skin:  Intact without significant lesions or rashes. Psych:  Alert and cooperative. Normal mood and affect.   Assessment   Angela Baker is a 36 y.o. female with a history of anxiety, depression, HLD, HTN, hypothyroidism, type 2 diabetes, anemia of chronic disease, secondary hyperparathyroidism and CKD stage IV*** presenting today for evaluation of possible cirrhosis on prior abdominal imaging  Abnormal CT imaging/Concern for cirrhosis: Nodularity of liver contour and hepatomegaly without splenomegaly on CT A/P in July 2023.  Labs at this time revealed elevated alk phos to 161, thrombocytopenia with platelets 94, normal hemoglobin.  Last A1c 7.6 in November 2022.    PLAN   *** Liver U/S with elastography CBC, CMP, PT/INR, GGT, IgG, IgA, IgM, hepatitis A antibody, hepatitis A IgM, hepatitis B surface antibody, hepatitis B core antibody,  hepatitis B surface antigen, HCV antibody, ANA, ASMA, AMA, antiliver kidney microsomal antibody, Alpha-1 antitrypsin    Venetia Night, MSN, FNP-BC, AGACNP-BC [facility]

## 2022-09-26 DIAGNOSIS — I10 Essential (primary) hypertension: Secondary | ICD-10-CM | POA: Diagnosis not present

## 2022-09-26 DIAGNOSIS — R079 Chest pain, unspecified: Secondary | ICD-10-CM | POA: Diagnosis not present

## 2022-09-26 DIAGNOSIS — E119 Type 2 diabetes mellitus without complications: Secondary | ICD-10-CM | POA: Diagnosis not present

## 2022-09-26 DIAGNOSIS — R0789 Other chest pain: Secondary | ICD-10-CM | POA: Diagnosis not present

## 2022-10-04 ENCOUNTER — Telehealth: Payer: Self-pay | Admitting: *Deleted

## 2022-10-04 NOTE — Chronic Care Management (AMB) (Signed)
  Care Coordination  Outreach Note  10/04/2022 Name: Angela Baker MRN: [id number] DOB: July 19, 1986   Care Coordination Outreach Attempts: An unsuccessful telephone outreach was attempted today to offer the patient information about available care coordination services as a benefit of their health plan.   Follow Up Plan:  Additional outreach attempts will be made to offer the patient care coordination information and services.   Encounter Outcome:  No Answer  [place]  Direct Dial: [phone]

## 2022-10-12 NOTE — Chronic Care Management (AMB) (Signed)
  Care Coordination  Outreach Note  10/12/2022 Name: Angela Baker MRN: [id number] DOB: 29-Jan-1986   Care Coordination Outreach Attempts: A second unsuccessful outreach was attempted today to offer the patient with information about available care coordination services as a benefit of their health plan.     Follow Up Plan:  Additional outreach attempts will be made to offer the patient care coordination information and services.   Encounter Outcome:  No Answer  [place]  Direct Dial: [phone]

## 2022-10-17 NOTE — Progress Notes (Signed)
  Care Coordination  Outreach Note  10/17/2022 Name: Angela Baker MRN: [id number] DOB: 1986-09-21   Care Coordination Outreach Attempts: A third unsuccessful outreach was attempted today to offer the patient with information about available care coordination services as a benefit of their health plan.   Follow Up Plan:  No further outreach attempts will be made at this time. We have been unable to contact the patient to offer or enroll patient in care coordination services  Encounter Outcome:  No Answer  [place]: [phone]
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
## 2022-10-24 ENCOUNTER — Encounter: Payer: Self-pay | Admitting: Gastroenterology

## 2022-10-24 ENCOUNTER — Ambulatory Visit (INDEPENDENT_AMBULATORY_CARE_PROVIDER_SITE_OTHER): Payer: Medicare HMO | Admitting: Gastroenterology

## 2022-10-24 VITALS — BP 109/74 | HR 105 | Temp 97.6°F | Ht 68.0 in | Wt 229.2 lb

## 2022-10-24 DIAGNOSIS — E1122 Type 2 diabetes mellitus with diabetic chronic kidney disease: Secondary | ICD-10-CM

## 2022-10-24 DIAGNOSIS — K5900 Constipation, unspecified: Secondary | ICD-10-CM | POA: Diagnosis not present

## 2022-10-24 DIAGNOSIS — K219 Gastro-esophageal reflux disease without esophagitis: Secondary | ICD-10-CM | POA: Diagnosis not present

## 2022-10-24 DIAGNOSIS — R112 Nausea with vomiting, unspecified: Secondary | ICD-10-CM

## 2022-10-24 DIAGNOSIS — K7460 Unspecified cirrhosis of liver: Secondary | ICD-10-CM

## 2022-10-24 DIAGNOSIS — N184 Chronic kidney disease, stage 4 (severe): Secondary | ICD-10-CM

## 2022-10-24 MED ORDER — ONDANSETRON 8 MG PO TBDP: 8.0000 mg | ORAL_TABLET | Freq: Three times a day (TID) | ORAL | 1 refills | Status: AC

## 2022-10-24 MED ORDER — PANTOPRAZOLE SODIUM 40 MG PO TBEC: 40.0000 mg | DELAYED_RELEASE_TABLET | Freq: Two times a day (BID) | ORAL | 3 refills | Status: AC

## 2022-10-24 NOTE — Patient Instructions (Addendum)
We are scheduling you for an upper endoscopy in the near future with Dr. Abbey Chatters.  You will receive separate written instructions in the mail but you will need to hold your Trulicity for 1 week prior to procedure.  You also need to come in for a preop appointment to give a urine pregnancy sample.  Cirrhosis education:  High-protein diet from a primarily plant-based diet. Avoid red meat.  No raw or undercooked meat, seafood, or shellfish. Low-fat/cholesterol/carbohydrate diet. Limit sodium to no more than 2000 mg/day including everything that you eat and drink. Recommend at least 30 minutes of aerobic and resistance exercise 3 days/week. Limit Tylenol to no more than 2000 mg/day. Continue to avoid alcohol Avoid NSAIDs (Aleve, Advil, ibuprofen, BC or Goody powders) Continue to work toward smoking cessation Increase exercise to at least 30 minutes/day 5 days/week.  For constipation: Fiber in your diet, you may supplement with an over-the-counter fiber supplement such as a gummy or something like Benefiber. You may begin MiraLAX 17 g daily or a few times a week to help with bowel regularity.  For your nausea and vomiting: Continue to take Zofran 8 mg 3 times a day as needed, I have refilled this for you today. Increase your pantoprazole to 40 mg twice daily, have also refilled this for you today. Consider talking with your PCP regarding an alternative treatment for your diabetes as Trulicity and other medications in this class can contribute to nausea and vomiting (medication induced gastroparesis).  The timing of your symptoms seems to correlate at the time he began this.  Follow a GERD diet:  Avoid fried, fatty, greasy, spicy, citrus foods. Avoid caffeine and carbonated beverages. Avoid chocolate. Try eating 4-6 small meals a day rather than 3 large meals. Do not eat within 3 hours of laying down. Prop head of bed up on wood or bricks to create a 6 inch incline.   I am ordering labs for  you have completed today or at your earliest convenience.  This is to rule out any other possible causes for cirrhosis other than metabolic dysfunction (diabetes, excess weight, etc.). You may go to any [facility] with your lab slips and have them done.   [facility] [place]  [phone] Closes soon ? 12:30?PM  [facility] [address]  [phone]  [place] [address]   It was a pleasure to see you today. I want to create trusting relationships with patients. If you receive a survey regarding your visit,  I greatly appreciate you taking time to fill this out on paper or through your MyChart. I value your feedback.  Venetia Night, MSN, FNP-BC, AGACNP-BC [facility]

## 2022-10-25 ENCOUNTER — Telehealth: Payer: Self-pay | Admitting: *Deleted

## 2022-10-25 ENCOUNTER — Telehealth: Payer: Self-pay | Admitting: Internal Medicine

## 2022-10-25 LAB — GAMMA GT: GGT: 177 IU/L — ABNORMAL HIGH (ref 0–60)

## 2022-10-25 LAB — COMPREHENSIVE METABOLIC PANEL
ALT: 11 IU/L (ref 0–32)
AST: 18 IU/L (ref 0–40)
Albumin/Globulin Ratio: 1.4 (ref 1.2–2.2)
Albumin: 4.8 g/dL (ref 3.9–4.9)
Alkaline Phosphatase: 202 IU/L — ABNORMAL HIGH (ref 44–121)
BUN/Creatinine Ratio: 10 (ref 9–23)
BUN: 32 mg/dL — ABNORMAL HIGH (ref 6–20)
Bilirubin Total: 0.8 mg/dL (ref 0.0–1.2)
CO2: 16 mmol/L — ABNORMAL LOW (ref 20–29)
Calcium: 10.1 mg/dL (ref 8.7–10.2)
Chloride: 101 mmol/L (ref 96–106)
Creatinine, Ser: 3.27 mg/dL — ABNORMAL HIGH (ref 0.57–1.00)
Globulin, Total: 3.4 g/dL (ref 1.5–4.5)
Glucose: 121 mg/dL — ABNORMAL HIGH (ref 70–99)
Potassium: 4.1 mmol/L (ref 3.5–5.2)
Sodium: 139 mmol/L (ref 134–144)
Total Protein: 8.2 g/dL (ref 6.0–8.5)
eGFR: 18 mL/min/{1.73_m2} — ABNORMAL LOW (ref >59)

## 2022-10-25 LAB — HEMOGLOBIN A1C
Est. average glucose Bld gHb Est-mCnc: 174 mg/dL
Hgb A1c MFr Bld: 7.7 % — ABNORMAL HIGH (ref 4.8–5.6)

## 2022-10-25 LAB — CBC
Hematocrit: 41.9 % (ref 34.0–46.6)
Hemoglobin: 14.2 g/dL (ref 11.1–15.9)
MCH: 28.9 pg (ref 26.6–33.0)
MCHC: 33.9 g/dL (ref 31.5–35.7)
MCV: 85 fL (ref 79–97)
Platelets: 163 10*3/uL (ref 150–450)
RBC: 4.92 x10E6/uL (ref 3.77–5.28)
RDW: 14.5 % (ref 11.7–15.4)
WBC: 13.6 10*3/uL — ABNORMAL HIGH (ref 3.4–10.8)

## 2022-10-25 LAB — MITOCHONDRIAL ANTIBODIES: Mitochondrial Ab: <20 Units (ref 0.0–20.0)

## 2022-10-25 LAB — IGG, IGA, IGM
IgA/Immunoglobulin A, Serum: 351 mg/dL (ref 87–352)
IgG (Immunoglobin G), Serum: 1377 mg/dL (ref 586–1602)
IgM (Immunoglobulin M), Srm: 124 mg/dL (ref 26–217)

## 2022-10-25 LAB — ANA W/REFLEX IF POSITIVE: Anti Nuclear Antibody (ANA): NEGATIVE

## 2022-10-25 LAB — HEPATITIS B SURFACE ANTIBODY,QUALITATIVE: Hep B Surface Ab, Qual: REACTIVE

## 2022-10-25 LAB — ALPHA-1-ANTITRYPSIN: A-1 Antitrypsin: 192 mg/dL — ABNORMAL HIGH (ref 100–188)

## 2022-10-25 LAB — HEPATITIS A ANTIBODY, TOTAL: hep A Total Ab: NEGATIVE

## 2022-10-25 LAB — HEPATITIS B CORE ANTIBODY, TOTAL: Hep B Core Total Ab: NEGATIVE

## 2022-10-25 LAB — PROTIME-INR
INR: 1 (ref 0.9–1.2)
Prothrombin Time: 11.1 s (ref 9.1–12.0)

## 2022-10-25 LAB — ANTI-SMOOTH MUSCLE ANTIBODY, IGG: Smooth Muscle Ab: 11 Units (ref 0–19)

## 2022-10-25 LAB — HEPATITIS A ANTIBODY, IGM: Hep A IgM: NEGATIVE

## 2022-10-25 LAB — HEPATITIS C ANTIBODY: Hep C Virus Ab: NONREACTIVE

## 2022-10-25 LAB — HEPATITIS B SURFACE ANTIGEN: Hepatitis B Surface Ag: NEGATIVE

## 2022-10-25 LAB — ANTI-MICROSOMAL ANTIBODY LIVER / KIDNEY: LKM1 Ab: 1.4 Units (ref 0.0–20.0)

## 2022-10-25 NOTE — Telephone Encounter (Signed)
Someone was calling for the patient about sending information to patient and gave an address of [address] [place], [place] [address]

## 2022-10-25 NOTE — Telephone Encounter (Signed)
LMTRC  Need to schedule EGD ASA 3 Dr.Carver

## 2022-10-28 DIAGNOSIS — R350 Frequency of micturition: Secondary | ICD-10-CM | POA: Diagnosis not present

## 2022-10-28 DIAGNOSIS — Z6833 Body mass index (BMI) 33.0-33.9, adult: Secondary | ICD-10-CM | POA: Diagnosis not present

## 2022-10-28 DIAGNOSIS — R8290 Unspecified abnormal findings in urine: Secondary | ICD-10-CM | POA: Diagnosis not present

## 2022-10-28 DIAGNOSIS — N184 Chronic kidney disease, stage 4 (severe): Secondary | ICD-10-CM | POA: Diagnosis not present

## 2022-10-28 DIAGNOSIS — M109 Gout, unspecified: Secondary | ICD-10-CM | POA: Diagnosis not present

## 2022-10-28 DIAGNOSIS — R109 Unspecified abdominal pain: Secondary | ICD-10-CM | POA: Diagnosis not present

## 2022-11-07 ENCOUNTER — Encounter: Payer: Self-pay | Admitting: *Deleted

## 2022-11-07 NOTE — Telephone Encounter (Signed)
Attempted to call pt. Will mail letter

## 2022-11-07 NOTE — Telephone Encounter (Signed)
Attempted to get in contact with pt. Will mail letter

## 2022-11-08 ENCOUNTER — Encounter: Payer: Self-pay | Admitting: *Deleted

## 2022-11-14 ENCOUNTER — Encounter: Payer: Self-pay | Admitting: *Deleted

## 2022-11-14 ENCOUNTER — Telehealth: Payer: Self-pay | Admitting: *Deleted

## 2022-11-14 NOTE — Telephone Encounter (Signed)
Pt has been scheduled for 12/23/22 at 12:30 pm with Dr.Carver. Pt is no longer on the trulicity. Instructions mailed to pt.

## 2022-11-14 NOTE — Telephone Encounter (Signed)
[employer] PA: Approved Authorization #[id number]  Tracking #[id number]  DOS:12/23/22-01/27/23

## 2022-11-14 NOTE — Telephone Encounter (Signed)
Spoke to pt, informed her of results and recommendations. Pt voiced understanding. Pt informed me that she has been itching all over.

## 2022-11-15 ENCOUNTER — Encounter: Payer: Self-pay | Admitting: *Deleted

## 2022-11-15 DIAGNOSIS — I129 Hypertensive chronic kidney disease with stage 1 through stage 4 chronic kidney disease, or unspecified chronic kidney disease: Secondary | ICD-10-CM | POA: Diagnosis not present

## 2022-11-15 DIAGNOSIS — E872 Acidosis, unspecified: Secondary | ICD-10-CM | POA: Diagnosis not present

## 2022-11-15 DIAGNOSIS — N2581 Secondary hyperparathyroidism of renal origin: Secondary | ICD-10-CM | POA: Diagnosis not present

## 2022-11-15 DIAGNOSIS — N390 Urinary tract infection, site not specified: Secondary | ICD-10-CM | POA: Diagnosis not present

## 2022-11-15 DIAGNOSIS — N184 Chronic kidney disease, stage 4 (severe): Secondary | ICD-10-CM | POA: Diagnosis not present

## 2022-11-15 DIAGNOSIS — R809 Proteinuria, unspecified: Secondary | ICD-10-CM | POA: Diagnosis not present

## 2022-11-15 DIAGNOSIS — M109 Gout, unspecified: Secondary | ICD-10-CM | POA: Diagnosis not present

## 2022-11-15 DIAGNOSIS — D631 Anemia in chronic kidney disease: Secondary | ICD-10-CM | POA: Diagnosis not present

## 2022-11-15 NOTE — Telephone Encounter (Signed)
Pre-op date mailed to pt

## 2022-11-15 NOTE — Telephone Encounter (Signed)
I have tried several times to reach pt. Phone rings and no voicemail. Will try back later.

## 2022-11-17 NOTE — Telephone Encounter (Signed)
Tried pt several times. She has not called back.

## 2022-11-21 DIAGNOSIS — I10 Essential (primary) hypertension: Secondary | ICD-10-CM | POA: Diagnosis not present

## 2022-11-21 DIAGNOSIS — N184 Chronic kidney disease, stage 4 (severe): Secondary | ICD-10-CM | POA: Diagnosis not present

## 2022-11-21 DIAGNOSIS — F419 Anxiety disorder, unspecified: Secondary | ICD-10-CM | POA: Diagnosis not present

## 2022-11-21 DIAGNOSIS — R110 Nausea: Secondary | ICD-10-CM | POA: Diagnosis not present

## 2022-11-21 DIAGNOSIS — N939 Abnormal uterine and vaginal bleeding, unspecified: Secondary | ICD-10-CM | POA: Diagnosis not present

## 2022-11-21 DIAGNOSIS — M109 Gout, unspecified: Secondary | ICD-10-CM | POA: Diagnosis not present

## 2022-11-21 DIAGNOSIS — E1165 Type 2 diabetes mellitus with hyperglycemia: Secondary | ICD-10-CM | POA: Diagnosis not present

## 2022-11-21 DIAGNOSIS — K7460 Unspecified cirrhosis of liver: Secondary | ICD-10-CM | POA: Diagnosis not present

## 2022-11-21 DIAGNOSIS — N83209 Unspecified ovarian cyst, unspecified side: Secondary | ICD-10-CM | POA: Diagnosis not present

## 2022-12-02 DIAGNOSIS — R030 Elevated blood-pressure reading, without diagnosis of hypertension: Secondary | ICD-10-CM | POA: Diagnosis not present

## 2022-12-02 DIAGNOSIS — Z20828 Contact with and (suspected) exposure to other viral communicable diseases: Secondary | ICD-10-CM | POA: Diagnosis not present

## 2022-12-02 DIAGNOSIS — J209 Acute bronchitis, unspecified: Secondary | ICD-10-CM | POA: Diagnosis not present

## 2022-12-02 DIAGNOSIS — R109 Unspecified abdominal pain: Secondary | ICD-10-CM | POA: Diagnosis not present

## 2022-12-16 NOTE — Patient Instructions (Signed)
Airiel Oblinger  12/16/2022     '@PREFPERIOPPHARMACY'$ @   Your procedure is scheduled on  12/23/2022.   Report to [facility] at  1015  A.M.   Call this number if you have problems the morning of surgery:  [phone]  If you experience any cold or flu symptoms such as cough, fever, chills, shortness of breath, etc. between now and your scheduled surgery, please notify us at the above number.   Remember:  Follow  the diet instructions given to you by the office.       Your last dose of trulicity should have been on 12/15/2022.       DO NOT take any medications for diabetes the morning of your procedure.     Take these medicines the morning of surgery with A SIP OF WATER        amlodipine, cymbalta, labetolol, levothyroxine, ativan (if needed), mitigare, zofran (if needed), pantoprazole.     Do not wear jewelry, make-up or nail polish.  Do not wear lotions, powders, or perfumes, or deodorant.  Do not shave 48 hours prior to surgery.  Men may shave face and neck.  Do not bring valuables to the hospital.  [facility] is not responsible for any belongings or valuables.  Contacts, dentures or bridgework may not be worn into surgery.  Leave your suitcase in the car.  After surgery it may be brought to your room.  For patients admitted to the hospital, discharge time will be determined by your treatment team.  Patients discharged the day of surgery will not be allowed to drive home and must have someone with them for 24 hours.    Special instructions:   DO NOT smoke tobacco or vape for 24 hors before your procedure.  Please read over the following fact sheets that you were given. Anesthesia Post-op Instructions and Care and Recovery After Surgery      Upper Endoscopy, Adult, Care After After the procedure, it is common to have a sore throat. It is also common to have: Mild stomach pain or discomfort. Bloating. Nausea. Follow these instructions at home: The  instructions below may help you care for yourself at home. Your health care provider may give you more instructions. If you have questions, ask your health care provider. If you were given a sedative during the procedure, it can affect you for several hours. Do not drive or operate machinery until your health care provider says that it is safe. If you will be going home right after the procedure, plan to have a responsible adult: Take you home from the hospital or clinic. You will not be allowed to drive. Care for you for the time you are told. Follow instructions from your health care provider about what you may eat and drink. Return to your normal activities as told by your health care provider. Ask your health care provider what activities are safe for you. Take over-the-counter and prescription medicines only as told by your health care provider. Contact a health care provider if you: Have a sore throat that lasts longer than one day. Have trouble swallowing. Have a fever. Get help right away if you: Vomit blood or your vomit looks like coffee grounds. Have bloody, black, or tarry stools. Have a very bad sore throat or you cannot swallow. Have difficulty breathing or very bad pain in your chest or abdomen. These symptoms may be an emergency. Get help right away. Call 911. Do not  wait to see if the symptoms will go away. Do not drive yourself to the hospital. Summary After the procedure, it is common to have a sore throat, mild stomach discomfort, bloating, and nausea. If you were given a sedative during the procedure, it can affect you for several hours. Do not drive until your health care provider says that it is safe. Follow instructions from your health care provider about what you may eat and drink. Return to your normal activities as told by your health care provider. This information is not intended to replace advice given to you by your health care provider. Make sure you discuss  any questions you have with your health care provider. Document Revised: 03/09/2022 Document Reviewed: 03/09/2022 Elsevier Patient Education  [place] After The following information offers guidance on how to care for yourself after your procedure. Your health care provider may also give you more specific instructions. If you have problems or questions, contact your health care provider. What can I expect after the procedure? After the procedure, it is common to have: Tiredness. Little or no memory about what happened during or after the procedure. Impaired judgment when it comes to making decisions. Nausea or vomiting. Some trouble with balance. Follow these instructions at home: For the time period you were told by your health care provider:  Rest. Do not participate in activities where you could fall or become injured. Do not drive or use machinery. Do not drink alcohol. Do not take sleeping pills or medicines that cause drowsiness. Do not make important decisions or sign legal documents. Do not take care of children on your own. Medicines Take over-the-counter and prescription medicines only as told by your health care provider. If you were prescribed antibiotics, take them as told by your health care provider. Do not stop using the antibiotic even if you start to feel better. Eating and drinking Follow instructions from your health care provider about what you may eat and drink. Drink enough fluid to keep your urine pale yellow. If you vomit: Drink clear fluids slowly and in small amounts as you are able. Clear fluids include water, ice chips, low-calorie sports drinks, and fruit juice that has water added to it (diluted fruit juice). Eat light and bland foods in small amounts as you are able. These foods include bananas, applesauce, rice, lean meats, toast, and crackers. General instructions  Have a responsible adult stay with you for  the time you are told. It is important to have someone help care for you until you are awake and alert. If you have sleep apnea, surgery and some medicines can increase your risk for breathing problems. Follow instructions from your health care provider about wearing your sleep device: When you are sleeping. This includes during daytime naps. While taking prescription pain medicines, sleeping medicines, or medicines that make you drowsy. Do not use any products that contain nicotine or tobacco. These products include cigarettes, chewing tobacco, and vaping devices, such as e-cigarettes. If you need help quitting, ask your health care provider. Contact a health care provider if: You feel nauseous or vomit every time you eat or drink. You feel light-headed. You are still sleepy or having trouble with balance after 24 hours. You get a rash. You have a fever. You have redness or swelling around the IV site. Get help right away if: You have trouble breathing. You have new confusion after you get home. These symptoms may be an emergency. Get help  right away. Call 911. Do not wait to see if the symptoms will go away. Do not drive yourself to the hospital. This information is not intended to replace advice given to you by your health care provider. Make sure you discuss any questions you have with your health care provider. Document Revised: 04/25/2022 Document Reviewed: 04/25/2022 Elsevier Patient Education  [place].

## 2022-12-20 ENCOUNTER — Encounter (HOSPITAL_COMMUNITY): Payer: Self-pay

## 2022-12-20 ENCOUNTER — Encounter (HOSPITAL_COMMUNITY)
Admission: RE | Admit: 2022-12-20 | Discharge: 2022-12-20 | Disposition: A | Discharge status: Home / Self Care | Payer: Medicare HMO | Source: Ambulatory Visit | Attending: Internal Medicine | Admitting: Internal Medicine

## 2022-12-20 ENCOUNTER — Telehealth: Payer: Self-pay | Admitting: *Deleted

## 2022-12-20 DIAGNOSIS — I10 Essential (primary) hypertension: Secondary | ICD-10-CM

## 2022-12-20 DIAGNOSIS — Z01818 Encounter for other preprocedural examination: Secondary | ICD-10-CM

## 2022-12-20 NOTE — Telephone Encounter (Signed)
Pt called to reschedule her procedure and left vm to return her call on "that number, it's a new phone and I don't know the number"

## 2022-12-22 ENCOUNTER — Encounter: Payer: Self-pay | Admitting: Gastroenterology

## 2022-12-23 ENCOUNTER — Encounter (HOSPITAL_COMMUNITY): Admission: RE | Payer: Self-pay | Source: Home / Self Care

## 2022-12-23 ENCOUNTER — Ambulatory Visit (HOSPITAL_COMMUNITY): Admission: RE | Admit: 2022-12-23 | Payer: Medicare HMO | Source: Home / Self Care

## 2022-12-23 SURGERY — ESOPHAGOGASTRODUODENOSCOPY (EGD) WITH PROPOFOL
Anesthesia: Monitor Anesthesia Care

## 2022-12-27 ENCOUNTER — Encounter: Payer: Self-pay | Admitting: *Deleted

## 2022-12-27 ENCOUNTER — Telehealth: Payer: Self-pay | Admitting: *Deleted

## 2022-12-27 NOTE — Telephone Encounter (Signed)
Someone left vm to call pt to reschedule her for her procedures. States to call pt back "on this number" but never leaves a number. Numbers in chart aren't working. Will mail letter.

## 2023-01-11 DIAGNOSIS — Z6834 Body mass index (BMI) 34.0-34.9, adult: Secondary | ICD-10-CM | POA: Diagnosis not present

## 2023-01-11 DIAGNOSIS — R030 Elevated blood-pressure reading, without diagnosis of hypertension: Secondary | ICD-10-CM | POA: Diagnosis not present

## 2023-01-11 DIAGNOSIS — N939 Abnormal uterine and vaginal bleeding, unspecified: Secondary | ICD-10-CM | POA: Diagnosis not present

## 2023-01-12 DIAGNOSIS — N939 Abnormal uterine and vaginal bleeding, unspecified: Secondary | ICD-10-CM | POA: Diagnosis not present

## 2023-01-12 DIAGNOSIS — N83202 Unspecified ovarian cyst, left side: Secondary | ICD-10-CM | POA: Diagnosis not present

## 2023-01-12 DIAGNOSIS — N858 Other specified noninflammatory disorders of uterus: Secondary | ICD-10-CM | POA: Diagnosis not present

## 2023-01-12 DIAGNOSIS — N83201 Unspecified ovarian cyst, right side: Secondary | ICD-10-CM | POA: Diagnosis not present

## 2023-01-12 DIAGNOSIS — N8500 Endometrial hyperplasia, unspecified: Secondary | ICD-10-CM | POA: Diagnosis not present

## 2023-01-18 DIAGNOSIS — E282 Polycystic ovarian syndrome: Secondary | ICD-10-CM | POA: Diagnosis not present

## 2023-01-18 DIAGNOSIS — N939 Abnormal uterine and vaginal bleeding, unspecified: Secondary | ICD-10-CM | POA: Diagnosis not present

## 2023-01-18 DIAGNOSIS — N930 Postcoital and contact bleeding: Secondary | ICD-10-CM | POA: Diagnosis not present

## 2023-01-25 DIAGNOSIS — D631 Anemia in chronic kidney disease: Secondary | ICD-10-CM | POA: Diagnosis not present

## 2023-01-25 DIAGNOSIS — R079 Chest pain, unspecified: Secondary | ICD-10-CM | POA: Diagnosis not present

## 2023-01-25 DIAGNOSIS — N2581 Secondary hyperparathyroidism of renal origin: Secondary | ICD-10-CM | POA: Diagnosis not present

## 2023-01-25 DIAGNOSIS — E872 Acidosis, unspecified: Secondary | ICD-10-CM | POA: Diagnosis not present

## 2023-01-25 DIAGNOSIS — N184 Chronic kidney disease, stage 4 (severe): Secondary | ICD-10-CM | POA: Diagnosis not present

## 2023-01-25 DIAGNOSIS — E559 Vitamin D deficiency, unspecified: Secondary | ICD-10-CM | POA: Diagnosis not present

## 2023-01-25 DIAGNOSIS — I129 Hypertensive chronic kidney disease with stage 1 through stage 4 chronic kidney disease, or unspecified chronic kidney disease: Secondary | ICD-10-CM | POA: Diagnosis not present

## 2023-02-03 DIAGNOSIS — N184 Chronic kidney disease, stage 4 (severe): Secondary | ICD-10-CM | POA: Diagnosis not present

## 2023-02-03 DIAGNOSIS — I129 Hypertensive chronic kidney disease with stage 1 through stage 4 chronic kidney disease, or unspecified chronic kidney disease: Secondary | ICD-10-CM | POA: Diagnosis not present

## 2023-02-03 DIAGNOSIS — D72829 Elevated white blood cell count, unspecified: Secondary | ICD-10-CM | POA: Diagnosis not present

## 2023-02-03 DIAGNOSIS — R531 Weakness: Secondary | ICD-10-CM | POA: Diagnosis not present

## 2023-02-03 DIAGNOSIS — Z79899 Other long term (current) drug therapy: Secondary | ICD-10-CM | POA: Diagnosis not present

## 2023-02-03 DIAGNOSIS — K7460 Unspecified cirrhosis of liver: Secondary | ICD-10-CM | POA: Diagnosis not present

## 2023-02-03 DIAGNOSIS — T50991A Poisoning by other drugs, medicaments and biological substances, accidental (unintentional), initial encounter: Secondary | ICD-10-CM | POA: Diagnosis not present

## 2023-02-03 DIAGNOSIS — M109 Gout, unspecified: Secondary | ICD-10-CM | POA: Diagnosis not present

## 2023-02-03 DIAGNOSIS — R9431 Abnormal electrocardiogram [ECG] [EKG]: Secondary | ICD-10-CM | POA: Diagnosis not present

## 2023-02-03 DIAGNOSIS — E039 Hypothyroidism, unspecified: Secondary | ICD-10-CM | POA: Diagnosis not present

## 2023-02-03 DIAGNOSIS — E1122 Type 2 diabetes mellitus with diabetic chronic kidney disease: Secondary | ICD-10-CM | POA: Diagnosis not present

## 2023-02-03 DIAGNOSIS — R4182 Altered mental status, unspecified: Secondary | ICD-10-CM | POA: Diagnosis not present

## 2023-02-03 DIAGNOSIS — R079 Chest pain, unspecified: Secondary | ICD-10-CM | POA: Diagnosis not present

## 2023-02-03 DIAGNOSIS — E860 Dehydration: Secondary | ICD-10-CM | POA: Diagnosis not present

## 2023-02-03 DIAGNOSIS — F1210 Cannabis abuse, uncomplicated: Secondary | ICD-10-CM | POA: Diagnosis not present

## 2023-02-03 DIAGNOSIS — Z792 Long term (current) use of antibiotics: Secondary | ICD-10-CM | POA: Diagnosis not present

## 2023-02-03 DIAGNOSIS — T40711A Poisoning by cannabis, accidental (unintentional), initial encounter: Secondary | ICD-10-CM | POA: Diagnosis not present

## 2023-02-03 DIAGNOSIS — R946 Abnormal results of thyroid function studies: Secondary | ICD-10-CM | POA: Diagnosis not present

## 2023-02-03 DIAGNOSIS — N182 Chronic kidney disease, stage 2 (mild): Secondary | ICD-10-CM | POA: Diagnosis not present

## 2023-02-03 DIAGNOSIS — M1A9XX0 Chronic gout, unspecified, without tophus (tophi): Secondary | ICD-10-CM | POA: Diagnosis not present

## 2023-02-03 DIAGNOSIS — R0602 Shortness of breath: Secondary | ICD-10-CM | POA: Diagnosis not present

## 2023-02-13 DIAGNOSIS — R079 Chest pain, unspecified: Secondary | ICD-10-CM | POA: Diagnosis not present

## 2023-02-13 DIAGNOSIS — E039 Hypothyroidism, unspecified: Secondary | ICD-10-CM | POA: Diagnosis not present

## 2023-02-21 DIAGNOSIS — R079 Chest pain, unspecified: Secondary | ICD-10-CM | POA: Diagnosis not present

## 2023-02-27 DIAGNOSIS — M109 Gout, unspecified: Secondary | ICD-10-CM | POA: Diagnosis not present

## 2023-02-27 DIAGNOSIS — R079 Chest pain, unspecified: Secondary | ICD-10-CM | POA: Diagnosis not present

## 2023-02-27 DIAGNOSIS — I10 Essential (primary) hypertension: Secondary | ICD-10-CM | POA: Diagnosis not present

## 2023-03-05 DIAGNOSIS — T50901A Poisoning by unspecified drugs, medicaments and biological substances, accidental (unintentional), initial encounter: Secondary | ICD-10-CM | POA: Diagnosis not present

## 2023-03-05 DIAGNOSIS — R079 Chest pain, unspecified: Secondary | ICD-10-CM | POA: Diagnosis not present

## 2023-03-05 DIAGNOSIS — Z6834 Body mass index (BMI) 34.0-34.9, adult: Secondary | ICD-10-CM | POA: Diagnosis not present

## 2023-03-14 DIAGNOSIS — E785 Hyperlipidemia, unspecified: Secondary | ICD-10-CM | POA: Diagnosis not present

## 2023-03-14 DIAGNOSIS — R002 Palpitations: Secondary | ICD-10-CM | POA: Diagnosis not present

## 2023-03-14 DIAGNOSIS — I25118 Atherosclerotic heart disease of native coronary artery with other forms of angina pectoris: Secondary | ICD-10-CM | POA: Insufficient documentation

## 2023-03-14 DIAGNOSIS — E1169 Type 2 diabetes mellitus with other specified complication: Secondary | ICD-10-CM | POA: Diagnosis not present

## 2023-03-14 DIAGNOSIS — N184 Chronic kidney disease, stage 4 (severe): Secondary | ICD-10-CM | POA: Diagnosis not present

## 2023-03-14 DIAGNOSIS — I10 Essential (primary) hypertension: Secondary | ICD-10-CM | POA: Diagnosis not present

## 2023-03-29 DIAGNOSIS — Z6835 Body mass index (BMI) 35.0-35.9, adult: Secondary | ICD-10-CM | POA: Diagnosis not present

## 2023-03-29 DIAGNOSIS — J069 Acute upper respiratory infection, unspecified: Secondary | ICD-10-CM | POA: Diagnosis not present

## 2023-03-29 DIAGNOSIS — R030 Elevated blood-pressure reading, without diagnosis of hypertension: Secondary | ICD-10-CM | POA: Diagnosis not present

## 2023-03-29 DIAGNOSIS — Z20828 Contact with and (suspected) exposure to other viral communicable diseases: Secondary | ICD-10-CM | POA: Diagnosis not present

## 2023-03-29 DIAGNOSIS — J45901 Unspecified asthma with (acute) exacerbation: Secondary | ICD-10-CM | POA: Diagnosis not present

## 2023-04-03 DIAGNOSIS — L989 Disorder of the skin and subcutaneous tissue, unspecified: Secondary | ICD-10-CM | POA: Diagnosis not present

## 2023-04-03 DIAGNOSIS — Z6835 Body mass index (BMI) 35.0-35.9, adult: Secondary | ICD-10-CM | POA: Diagnosis not present

## 2023-04-03 DIAGNOSIS — I10 Essential (primary) hypertension: Secondary | ICD-10-CM | POA: Diagnosis not present

## 2023-04-03 DIAGNOSIS — Z0100 Encounter for examination of eyes and vision without abnormal findings: Secondary | ICD-10-CM | POA: Diagnosis not present

## 2023-04-03 DIAGNOSIS — M7989 Other specified soft tissue disorders: Secondary | ICD-10-CM | POA: Diagnosis not present

## 2023-04-07 DIAGNOSIS — R002 Palpitations: Secondary | ICD-10-CM | POA: Diagnosis not present

## 2023-04-10 DIAGNOSIS — R002 Palpitations: Secondary | ICD-10-CM | POA: Diagnosis not present

## 2023-04-19 DIAGNOSIS — M7989 Other specified soft tissue disorders: Secondary | ICD-10-CM | POA: Diagnosis not present

## 2023-04-19 DIAGNOSIS — N6489 Other specified disorders of breast: Secondary | ICD-10-CM | POA: Diagnosis not present

## 2023-04-19 DIAGNOSIS — R92313 Mammographic fatty tissue density, bilateral breasts: Secondary | ICD-10-CM | POA: Diagnosis not present

## 2023-04-21 DIAGNOSIS — Z1389 Encounter for screening for other disorder: Secondary | ICD-10-CM | POA: Diagnosis not present

## 2023-04-21 DIAGNOSIS — Z1331 Encounter for screening for depression: Secondary | ICD-10-CM | POA: Diagnosis not present

## 2023-04-21 DIAGNOSIS — Z0001 Encounter for general adult medical examination with abnormal findings: Secondary | ICD-10-CM | POA: Diagnosis not present

## 2023-04-21 DIAGNOSIS — Z6835 Body mass index (BMI) 35.0-35.9, adult: Secondary | ICD-10-CM | POA: Diagnosis not present

## 2023-04-21 DIAGNOSIS — R030 Elevated blood-pressure reading, without diagnosis of hypertension: Secondary | ICD-10-CM | POA: Diagnosis not present

## 2023-04-21 DIAGNOSIS — B079 Viral wart, unspecified: Secondary | ICD-10-CM | POA: Diagnosis not present

## 2023-05-03 DIAGNOSIS — M109 Gout, unspecified: Secondary | ICD-10-CM | POA: Diagnosis not present

## 2023-05-03 DIAGNOSIS — I10 Essential (primary) hypertension: Secondary | ICD-10-CM | POA: Diagnosis not present

## 2023-05-03 DIAGNOSIS — Z6835 Body mass index (BMI) 35.0-35.9, adult: Secondary | ICD-10-CM | POA: Diagnosis not present

## 2023-05-15 DIAGNOSIS — I25119 Atherosclerotic heart disease of native coronary artery with unspecified angina pectoris: Secondary | ICD-10-CM | POA: Diagnosis not present

## 2023-05-15 DIAGNOSIS — I081 Rheumatic disorders of both mitral and tricuspid valves: Secondary | ICD-10-CM | POA: Diagnosis not present

## 2023-05-15 DIAGNOSIS — I5189 Other ill-defined heart diseases: Secondary | ICD-10-CM | POA: Diagnosis not present

## 2023-05-15 DIAGNOSIS — I10 Essential (primary) hypertension: Secondary | ICD-10-CM | POA: Diagnosis not present

## 2023-05-19 DIAGNOSIS — R030 Elevated blood-pressure reading, without diagnosis of hypertension: Secondary | ICD-10-CM | POA: Diagnosis not present

## 2023-05-19 DIAGNOSIS — M109 Gout, unspecified: Secondary | ICD-10-CM | POA: Diagnosis not present

## 2023-06-07 DIAGNOSIS — M25521 Pain in right elbow: Secondary | ICD-10-CM | POA: Diagnosis not present

## 2023-06-07 DIAGNOSIS — D649 Anemia, unspecified: Secondary | ICD-10-CM | POA: Diagnosis not present

## 2023-06-07 DIAGNOSIS — I129 Hypertensive chronic kidney disease with stage 1 through stage 4 chronic kidney disease, or unspecified chronic kidney disease: Secondary | ICD-10-CM | POA: Diagnosis not present

## 2023-06-07 DIAGNOSIS — E039 Hypothyroidism, unspecified: Secondary | ICD-10-CM | POA: Diagnosis not present

## 2023-06-07 DIAGNOSIS — E669 Obesity, unspecified: Secondary | ICD-10-CM | POA: Diagnosis not present

## 2023-06-07 DIAGNOSIS — K7460 Unspecified cirrhosis of liver: Secondary | ICD-10-CM | POA: Diagnosis not present

## 2023-06-07 DIAGNOSIS — E7800 Pure hypercholesterolemia, unspecified: Secondary | ICD-10-CM | POA: Diagnosis not present

## 2023-06-07 DIAGNOSIS — M10021 Idiopathic gout, right elbow: Secondary | ICD-10-CM | POA: Diagnosis not present

## 2023-06-07 DIAGNOSIS — N184 Chronic kidney disease, stage 4 (severe): Secondary | ICD-10-CM | POA: Diagnosis not present

## 2023-06-07 DIAGNOSIS — M109 Gout, unspecified: Secondary | ICD-10-CM | POA: Diagnosis not present

## 2023-06-07 DIAGNOSIS — E1122 Type 2 diabetes mellitus with diabetic chronic kidney disease: Secondary | ICD-10-CM | POA: Diagnosis not present

## 2023-06-08 DIAGNOSIS — M109 Gout, unspecified: Secondary | ICD-10-CM | POA: Diagnosis not present

## 2023-06-08 DIAGNOSIS — N184 Chronic kidney disease, stage 4 (severe): Secondary | ICD-10-CM | POA: Diagnosis not present

## 2023-06-16 DIAGNOSIS — N184 Chronic kidney disease, stage 4 (severe): Secondary | ICD-10-CM | POA: Diagnosis not present

## 2023-06-16 DIAGNOSIS — R1310 Dysphagia, unspecified: Secondary | ICD-10-CM | POA: Diagnosis not present

## 2023-06-16 DIAGNOSIS — Z720 Tobacco use: Secondary | ICD-10-CM | POA: Diagnosis not present

## 2023-06-16 DIAGNOSIS — N926 Irregular menstruation, unspecified: Secondary | ICD-10-CM | POA: Diagnosis not present

## 2023-06-16 DIAGNOSIS — F1290 Cannabis use, unspecified, uncomplicated: Secondary | ICD-10-CM | POA: Diagnosis not present

## 2023-06-16 DIAGNOSIS — R1011 Right upper quadrant pain: Secondary | ICD-10-CM | POA: Diagnosis not present

## 2023-06-16 DIAGNOSIS — Z6834 Body mass index (BMI) 34.0-34.9, adult: Secondary | ICD-10-CM | POA: Diagnosis not present

## 2023-06-16 DIAGNOSIS — I10 Essential (primary) hypertension: Secondary | ICD-10-CM | POA: Diagnosis not present

## 2023-06-16 DIAGNOSIS — M109 Gout, unspecified: Secondary | ICD-10-CM | POA: Diagnosis not present

## 2023-06-16 DIAGNOSIS — I2510 Atherosclerotic heart disease of native coronary artery without angina pectoris: Secondary | ICD-10-CM | POA: Diagnosis not present

## 2023-06-21 DIAGNOSIS — R0789 Other chest pain: Secondary | ICD-10-CM | POA: Diagnosis not present

## 2023-06-21 DIAGNOSIS — E7800 Pure hypercholesterolemia, unspecified: Secondary | ICD-10-CM | POA: Diagnosis not present

## 2023-06-21 DIAGNOSIS — E860 Dehydration: Secondary | ICD-10-CM | POA: Diagnosis not present

## 2023-06-21 DIAGNOSIS — E039 Hypothyroidism, unspecified: Secondary | ICD-10-CM | POA: Diagnosis not present

## 2023-06-21 DIAGNOSIS — R079 Chest pain, unspecified: Secondary | ICD-10-CM | POA: Diagnosis not present

## 2023-06-21 DIAGNOSIS — N184 Chronic kidney disease, stage 4 (severe): Secondary | ICD-10-CM | POA: Diagnosis not present

## 2023-06-21 DIAGNOSIS — I129 Hypertensive chronic kidney disease with stage 1 through stage 4 chronic kidney disease, or unspecified chronic kidney disease: Secondary | ICD-10-CM | POA: Diagnosis not present

## 2023-06-21 DIAGNOSIS — K7460 Unspecified cirrhosis of liver: Secondary | ICD-10-CM | POA: Diagnosis not present

## 2023-06-21 DIAGNOSIS — R0600 Dyspnea, unspecified: Secondary | ICD-10-CM | POA: Diagnosis not present

## 2023-06-21 DIAGNOSIS — E1122 Type 2 diabetes mellitus with diabetic chronic kidney disease: Secondary | ICD-10-CM | POA: Diagnosis not present

## 2023-06-21 DIAGNOSIS — R0602 Shortness of breath: Secondary | ICD-10-CM | POA: Diagnosis not present

## 2023-06-23 DIAGNOSIS — F17200 Nicotine dependence, unspecified, uncomplicated: Secondary | ICD-10-CM | POA: Diagnosis not present

## 2023-06-23 DIAGNOSIS — I214 Non-ST elevation (NSTEMI) myocardial infarction: Secondary | ICD-10-CM | POA: Diagnosis not present

## 2023-06-23 DIAGNOSIS — R739 Hyperglycemia, unspecified: Secondary | ICD-10-CM | POA: Diagnosis not present

## 2023-06-23 DIAGNOSIS — E039 Hypothyroidism, unspecified: Secondary | ICD-10-CM | POA: Diagnosis not present

## 2023-06-23 DIAGNOSIS — F32A Depression, unspecified: Secondary | ICD-10-CM | POA: Diagnosis not present

## 2023-06-23 DIAGNOSIS — N838 Other noninflammatory disorders of ovary, fallopian tube and broad ligament: Secondary | ICD-10-CM | POA: Diagnosis not present

## 2023-06-23 DIAGNOSIS — I21A1 Myocardial infarction type 2: Secondary | ICD-10-CM | POA: Diagnosis not present

## 2023-06-23 DIAGNOSIS — R0602 Shortness of breath: Secondary | ICD-10-CM | POA: Diagnosis not present

## 2023-06-23 DIAGNOSIS — K7460 Unspecified cirrhosis of liver: Secondary | ICD-10-CM | POA: Diagnosis not present

## 2023-06-23 DIAGNOSIS — K219 Gastro-esophageal reflux disease without esophagitis: Secondary | ICD-10-CM | POA: Diagnosis not present

## 2023-06-23 DIAGNOSIS — I1310 Hypertensive heart and chronic kidney disease without heart failure, with stage 1 through stage 4 chronic kidney disease, or unspecified chronic kidney disease: Secondary | ICD-10-CM | POA: Diagnosis not present

## 2023-06-23 DIAGNOSIS — N281 Cyst of kidney, acquired: Secondary | ICD-10-CM | POA: Diagnosis not present

## 2023-06-23 DIAGNOSIS — R140 Abdominal distension (gaseous): Secondary | ICD-10-CM | POA: Diagnosis not present

## 2023-06-23 DIAGNOSIS — E669 Obesity, unspecified: Secondary | ICD-10-CM | POA: Diagnosis not present

## 2023-06-23 DIAGNOSIS — E7800 Pure hypercholesterolemia, unspecified: Secondary | ICD-10-CM | POA: Diagnosis not present

## 2023-06-23 DIAGNOSIS — M40204 Unspecified kyphosis, thoracic region: Secondary | ICD-10-CM | POA: Diagnosis not present

## 2023-06-23 DIAGNOSIS — D649 Anemia, unspecified: Secondary | ICD-10-CM | POA: Diagnosis not present

## 2023-06-23 DIAGNOSIS — I517 Cardiomegaly: Secondary | ICD-10-CM | POA: Diagnosis not present

## 2023-06-23 DIAGNOSIS — E1122 Type 2 diabetes mellitus with diabetic chronic kidney disease: Secondary | ICD-10-CM | POA: Diagnosis not present

## 2023-06-23 DIAGNOSIS — R072 Precordial pain: Secondary | ICD-10-CM | POA: Diagnosis not present

## 2023-06-23 DIAGNOSIS — D631 Anemia in chronic kidney disease: Secondary | ICD-10-CM | POA: Diagnosis not present

## 2023-06-23 DIAGNOSIS — R079 Chest pain, unspecified: Secondary | ICD-10-CM | POA: Diagnosis not present

## 2023-06-23 DIAGNOSIS — N179 Acute kidney failure, unspecified: Secondary | ICD-10-CM | POA: Diagnosis not present

## 2023-06-23 DIAGNOSIS — Z720 Tobacco use: Secondary | ICD-10-CM | POA: Diagnosis not present

## 2023-06-23 DIAGNOSIS — D62 Acute posthemorrhagic anemia: Secondary | ICD-10-CM | POA: Diagnosis not present

## 2023-06-23 DIAGNOSIS — N184 Chronic kidney disease, stage 4 (severe): Secondary | ICD-10-CM | POA: Diagnosis not present

## 2023-06-23 DIAGNOSIS — I358 Other nonrheumatic aortic valve disorders: Secondary | ICD-10-CM | POA: Diagnosis not present

## 2023-06-28 ENCOUNTER — Encounter: Payer: Self-pay | Admitting: Internal Medicine

## 2023-07-13 DIAGNOSIS — N184 Chronic kidney disease, stage 4 (severe): Secondary | ICD-10-CM | POA: Diagnosis not present

## 2023-07-13 DIAGNOSIS — Z6833 Body mass index (BMI) 33.0-33.9, adult: Secondary | ICD-10-CM | POA: Diagnosis not present

## 2023-07-13 DIAGNOSIS — R030 Elevated blood-pressure reading, without diagnosis of hypertension: Secondary | ICD-10-CM | POA: Diagnosis not present

## 2023-07-13 DIAGNOSIS — M109 Gout, unspecified: Secondary | ICD-10-CM | POA: Diagnosis not present

## 2023-07-19 DIAGNOSIS — D631 Anemia in chronic kidney disease: Secondary | ICD-10-CM | POA: Diagnosis not present

## 2023-07-19 DIAGNOSIS — N2581 Secondary hyperparathyroidism of renal origin: Secondary | ICD-10-CM | POA: Diagnosis not present

## 2023-07-19 DIAGNOSIS — I129 Hypertensive chronic kidney disease with stage 1 through stage 4 chronic kidney disease, or unspecified chronic kidney disease: Secondary | ICD-10-CM | POA: Diagnosis not present

## 2023-07-19 DIAGNOSIS — R079 Chest pain, unspecified: Secondary | ICD-10-CM | POA: Diagnosis not present

## 2023-07-19 DIAGNOSIS — N184 Chronic kidney disease, stage 4 (severe): Secondary | ICD-10-CM | POA: Diagnosis not present

## 2023-07-19 DIAGNOSIS — N939 Abnormal uterine and vaginal bleeding, unspecified: Secondary | ICD-10-CM | POA: Diagnosis not present

## 2023-07-22 DIAGNOSIS — N184 Chronic kidney disease, stage 4 (severe): Secondary | ICD-10-CM | POA: Diagnosis not present

## 2023-07-22 DIAGNOSIS — M109 Gout, unspecified: Secondary | ICD-10-CM | POA: Diagnosis not present

## 2023-07-22 DIAGNOSIS — R1310 Dysphagia, unspecified: Secondary | ICD-10-CM | POA: Diagnosis not present

## 2023-07-22 DIAGNOSIS — I2510 Atherosclerotic heart disease of native coronary artery without angina pectoris: Secondary | ICD-10-CM | POA: Diagnosis not present

## 2023-07-22 DIAGNOSIS — I10 Essential (primary) hypertension: Secondary | ICD-10-CM | POA: Diagnosis not present

## 2023-07-22 DIAGNOSIS — K219 Gastro-esophageal reflux disease without esophagitis: Secondary | ICD-10-CM | POA: Diagnosis not present

## 2023-07-22 DIAGNOSIS — N926 Irregular menstruation, unspecified: Secondary | ICD-10-CM | POA: Diagnosis not present

## 2023-07-22 DIAGNOSIS — I214 Non-ST elevation (NSTEMI) myocardial infarction: Secondary | ICD-10-CM | POA: Diagnosis not present

## 2023-07-22 DIAGNOSIS — E1165 Type 2 diabetes mellitus with hyperglycemia: Secondary | ICD-10-CM | POA: Diagnosis not present

## 2023-08-21 DIAGNOSIS — E669 Obesity, unspecified: Secondary | ICD-10-CM | POA: Diagnosis not present

## 2023-08-21 DIAGNOSIS — M25522 Pain in left elbow: Secondary | ICD-10-CM | POA: Diagnosis not present

## 2023-08-21 DIAGNOSIS — M1991 Primary osteoarthritis, unspecified site: Secondary | ICD-10-CM | POA: Diagnosis not present

## 2023-08-21 DIAGNOSIS — E1122 Type 2 diabetes mellitus with diabetic chronic kidney disease: Secondary | ICD-10-CM | POA: Diagnosis not present

## 2023-08-21 DIAGNOSIS — M109 Gout, unspecified: Secondary | ICD-10-CM | POA: Diagnosis not present

## 2023-08-21 DIAGNOSIS — N184 Chronic kidney disease, stage 4 (severe): Secondary | ICD-10-CM | POA: Diagnosis not present

## 2023-08-21 DIAGNOSIS — E7800 Pure hypercholesterolemia, unspecified: Secondary | ICD-10-CM | POA: Diagnosis not present

## 2023-08-21 DIAGNOSIS — I129 Hypertensive chronic kidney disease with stage 1 through stage 4 chronic kidney disease, or unspecified chronic kidney disease: Secondary | ICD-10-CM | POA: Diagnosis not present

## 2023-08-21 DIAGNOSIS — M1000 Idiopathic gout, unspecified site: Secondary | ICD-10-CM | POA: Diagnosis not present

## 2023-08-21 DIAGNOSIS — E079 Disorder of thyroid, unspecified: Secondary | ICD-10-CM | POA: Diagnosis not present

## 2023-08-21 IMAGING — US US RENAL
1 series · 14 of 25 positions shown · non-contrast
Comparison: None Available.

CLINICAL DATA: Chronic renal disease

EXAM:
RENAL / URINARY TRACT ULTRASOUND COMPLETE

[Series 1: us renal · 0.30mm/px · 14 of 45 slices shown]
[im 1/45]
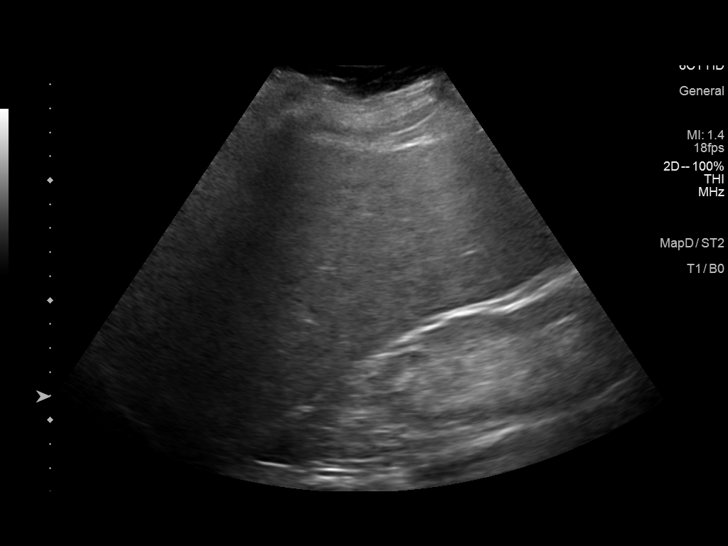
[im 4/45]
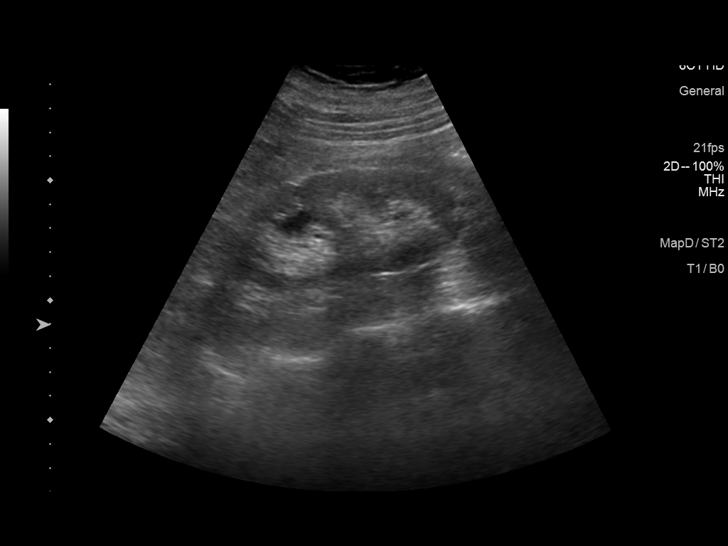
[im 8/45]
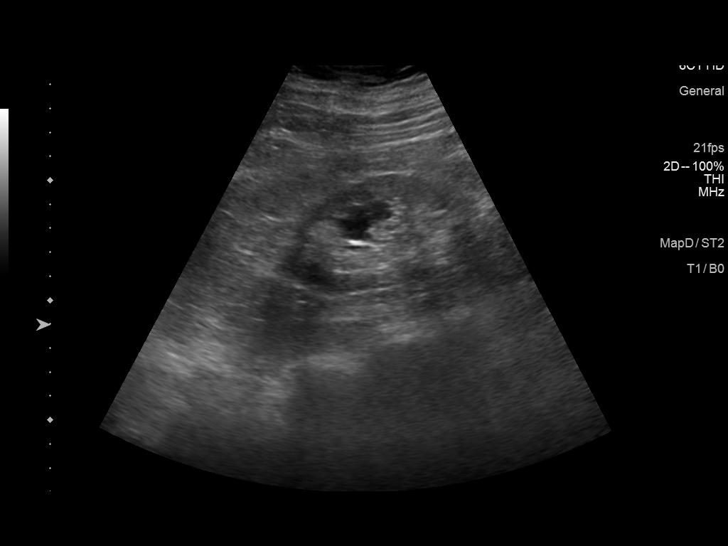
[im 12/45]
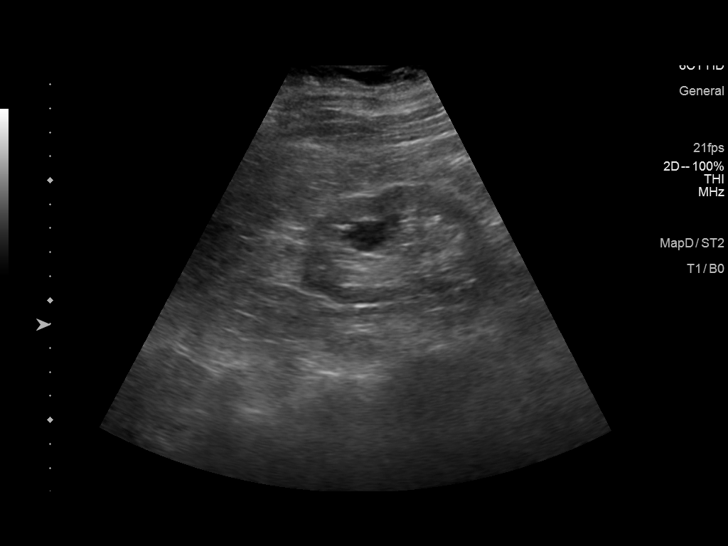
[im 15/45]
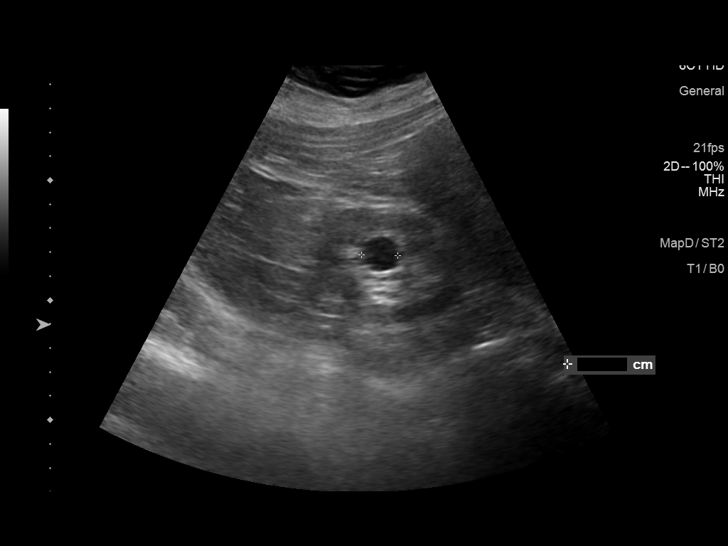
[im 17/45]
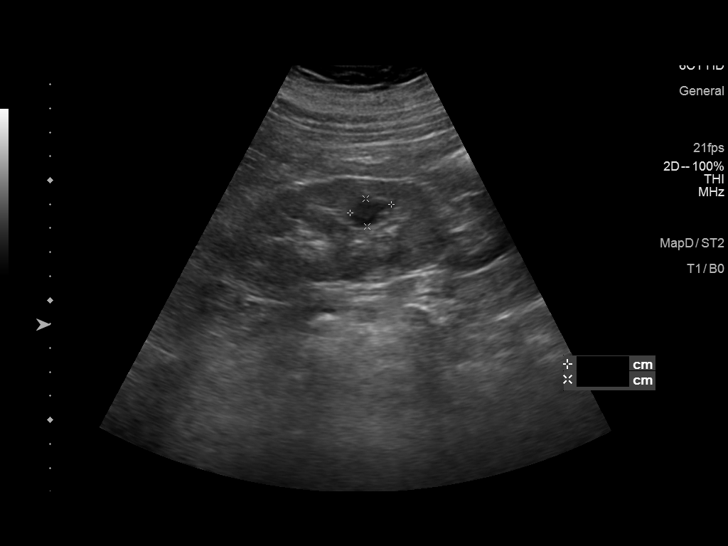
[im 21/45]
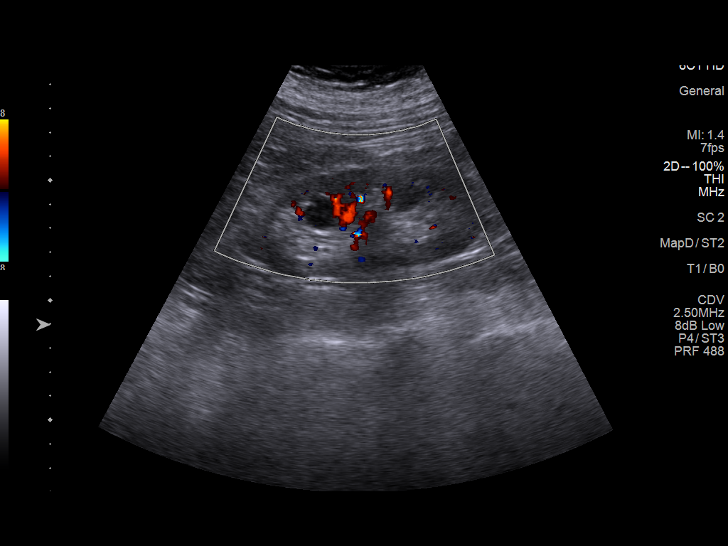
[im 24/45]
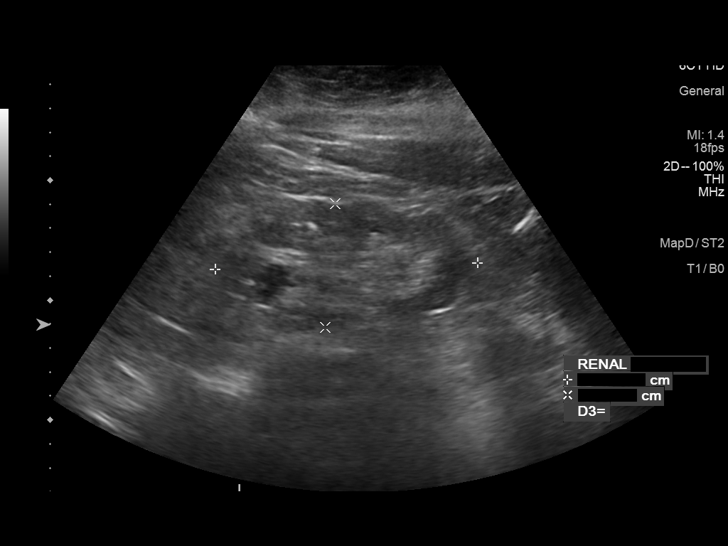
[im 28/45]
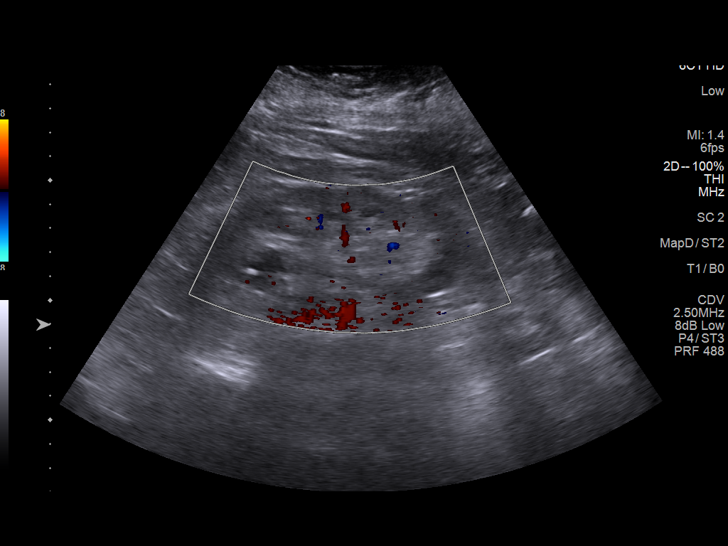
[im 30/45]
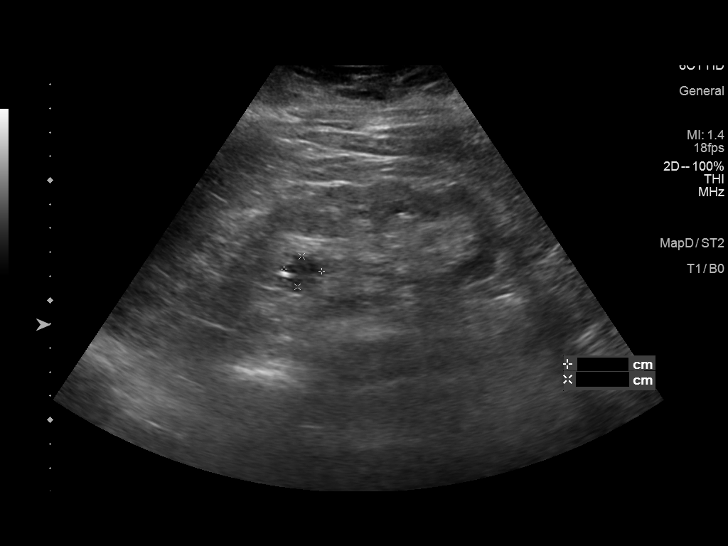
[im 34/45]
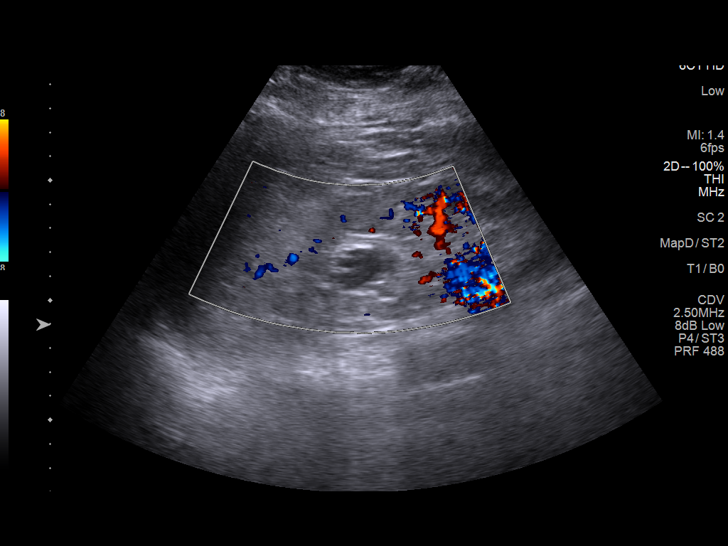
[im 37/45]
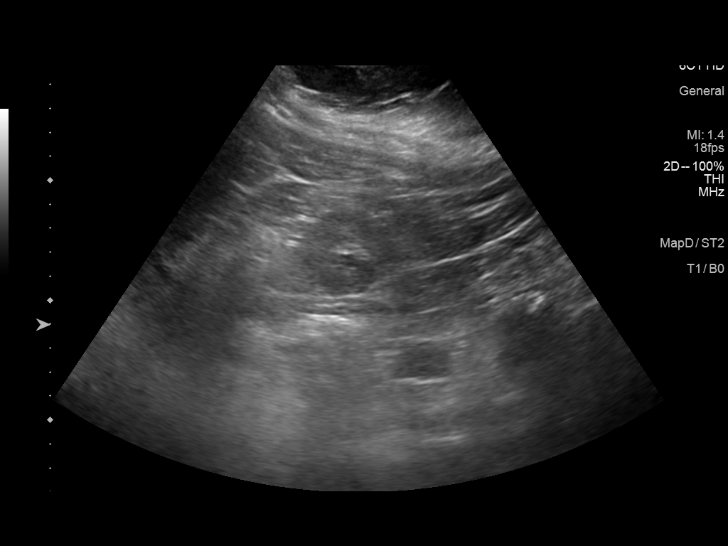
[im 41/45]
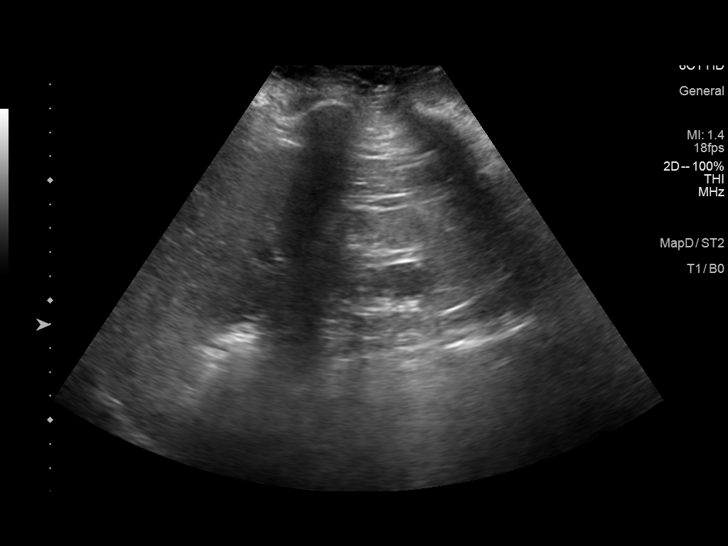
[im 45/45]
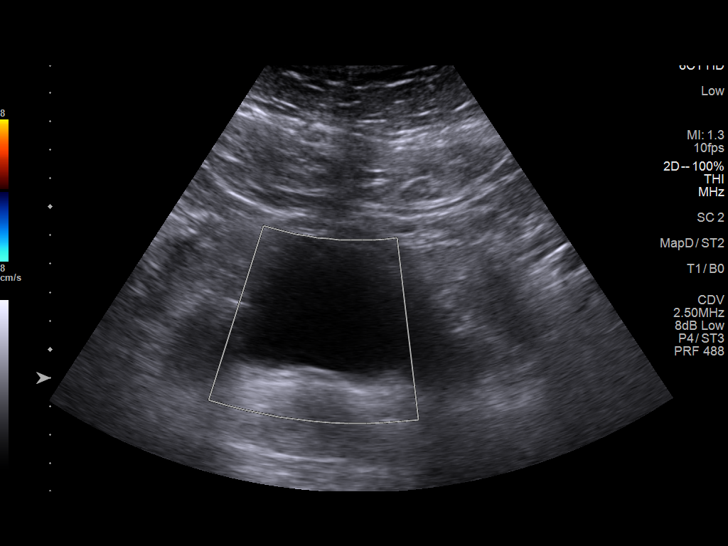

[14 of 25 positions shown; findings below may reference images not displayed]

FINDINGS: Right Kidney:

Renal measurements: 11.0 x 4.5 x 4.9 cm = volume: 127 mL. There are
2 masses, favored to represent complex cysts. The largest measures
2.2 x 1.4 x 1.5 cm. The smaller measures 1.8 x 1.2 x 1.2 cm.

Left Kidney:

Renal measurements: 11.0 x 5.2 x 5.2 cm = volume: 155 mL. Contains
an indeterminate mass measuring 1.6 x 1.3 x 2.2 cm favored to
represent a complex cyst.

Bladder:

Appears normal for degree of bladder distention.

Other:

None.
IMPRESSION: Bilateral renal masses are favored to represent complex cysts but
incompletely characterized. Recommend MRI with and without contrast
for further characterization.

Both kidneys demonstrate increased cortical echogenicity consistent
with medical renal disease.

These results will be called to the ordering clinician or
representative by the Radiologist Assistant, and communication
documented in the PACS or [REDACTED].

## 2023-08-28 DIAGNOSIS — M545 Low back pain, unspecified: Secondary | ICD-10-CM | POA: Diagnosis not present

## 2023-08-28 DIAGNOSIS — R739 Hyperglycemia, unspecified: Secondary | ICD-10-CM | POA: Diagnosis not present

## 2023-08-28 DIAGNOSIS — N184 Chronic kidney disease, stage 4 (severe): Secondary | ICD-10-CM | POA: Diagnosis not present

## 2023-08-28 DIAGNOSIS — I10 Essential (primary) hypertension: Secondary | ICD-10-CM | POA: Diagnosis not present

## 2023-08-28 DIAGNOSIS — Z882 Allergy status to sulfonamides status: Secondary | ICD-10-CM | POA: Diagnosis not present

## 2023-08-28 DIAGNOSIS — M25561 Pain in right knee: Secondary | ICD-10-CM | POA: Diagnosis not present

## 2023-08-28 DIAGNOSIS — M549 Dorsalgia, unspecified: Secondary | ICD-10-CM | POA: Diagnosis not present

## 2023-08-28 DIAGNOSIS — Z79899 Other long term (current) drug therapy: Secondary | ICD-10-CM | POA: Diagnosis not present

## 2023-09-11 DIAGNOSIS — M109 Gout, unspecified: Secondary | ICD-10-CM | POA: Diagnosis not present

## 2023-09-11 DIAGNOSIS — J4520 Mild intermittent asthma, uncomplicated: Secondary | ICD-10-CM | POA: Diagnosis not present

## 2023-09-11 DIAGNOSIS — F419 Anxiety disorder, unspecified: Secondary | ICD-10-CM | POA: Diagnosis not present

## 2023-09-11 DIAGNOSIS — I10 Essential (primary) hypertension: Secondary | ICD-10-CM | POA: Diagnosis not present

## 2023-09-11 DIAGNOSIS — F324 Major depressive disorder, single episode, in partial remission: Secondary | ICD-10-CM | POA: Diagnosis not present

## 2023-09-11 DIAGNOSIS — N184 Chronic kidney disease, stage 4 (severe): Secondary | ICD-10-CM | POA: Diagnosis not present

## 2023-09-11 DIAGNOSIS — K219 Gastro-esophageal reflux disease without esophagitis: Secondary | ICD-10-CM | POA: Diagnosis not present

## 2023-09-11 DIAGNOSIS — E782 Mixed hyperlipidemia: Secondary | ICD-10-CM | POA: Diagnosis not present

## 2023-09-11 DIAGNOSIS — E1122 Type 2 diabetes mellitus with diabetic chronic kidney disease: Secondary | ICD-10-CM | POA: Diagnosis not present

## 2023-09-29 DIAGNOSIS — N184 Chronic kidney disease, stage 4 (severe): Secondary | ICD-10-CM | POA: Diagnosis not present

## 2023-09-29 DIAGNOSIS — N764 Abscess of vulva: Secondary | ICD-10-CM | POA: Diagnosis not present

## 2023-09-29 DIAGNOSIS — E7800 Pure hypercholesterolemia, unspecified: Secondary | ICD-10-CM | POA: Diagnosis not present

## 2023-09-29 DIAGNOSIS — J45909 Unspecified asthma, uncomplicated: Secondary | ICD-10-CM | POA: Diagnosis not present

## 2023-09-29 DIAGNOSIS — I129 Hypertensive chronic kidney disease with stage 1 through stage 4 chronic kidney disease, or unspecified chronic kidney disease: Secondary | ICD-10-CM | POA: Diagnosis not present

## 2023-09-29 DIAGNOSIS — F32A Depression, unspecified: Secondary | ICD-10-CM | POA: Diagnosis not present

## 2023-09-29 DIAGNOSIS — E039 Hypothyroidism, unspecified: Secondary | ICD-10-CM | POA: Diagnosis not present

## 2023-09-29 DIAGNOSIS — F419 Anxiety disorder, unspecified: Secondary | ICD-10-CM | POA: Diagnosis not present

## 2023-09-29 DIAGNOSIS — E1122 Type 2 diabetes mellitus with diabetic chronic kidney disease: Secondary | ICD-10-CM | POA: Diagnosis not present

## 2023-10-12 DIAGNOSIS — M109 Gout, unspecified: Secondary | ICD-10-CM | POA: Diagnosis not present

## 2023-10-12 DIAGNOSIS — E1122 Type 2 diabetes mellitus with diabetic chronic kidney disease: Secondary | ICD-10-CM | POA: Diagnosis not present

## 2023-10-17 DIAGNOSIS — N926 Irregular menstruation, unspecified: Secondary | ICD-10-CM | POA: Diagnosis not present

## 2023-10-17 DIAGNOSIS — D529 Folate deficiency anemia, unspecified: Secondary | ICD-10-CM | POA: Diagnosis not present

## 2023-10-17 DIAGNOSIS — I214 Non-ST elevation (NSTEMI) myocardial infarction: Secondary | ICD-10-CM | POA: Diagnosis not present

## 2023-10-17 DIAGNOSIS — K219 Gastro-esophageal reflux disease without esophagitis: Secondary | ICD-10-CM | POA: Diagnosis not present

## 2023-10-17 DIAGNOSIS — I10 Essential (primary) hypertension: Secondary | ICD-10-CM | POA: Diagnosis not present

## 2023-10-17 DIAGNOSIS — N184 Chronic kidney disease, stage 4 (severe): Secondary | ICD-10-CM | POA: Diagnosis not present

## 2023-10-17 DIAGNOSIS — E1165 Type 2 diabetes mellitus with hyperglycemia: Secondary | ICD-10-CM | POA: Diagnosis not present

## 2023-10-17 DIAGNOSIS — R1310 Dysphagia, unspecified: Secondary | ICD-10-CM | POA: Diagnosis not present

## 2023-10-17 DIAGNOSIS — I2510 Atherosclerotic heart disease of native coronary artery without angina pectoris: Secondary | ICD-10-CM | POA: Diagnosis not present

## 2023-10-17 DIAGNOSIS — M109 Gout, unspecified: Secondary | ICD-10-CM | POA: Diagnosis not present

## 2023-10-17 DIAGNOSIS — Z23 Encounter for immunization: Secondary | ICD-10-CM | POA: Diagnosis not present

## 2023-10-19 DIAGNOSIS — N926 Irregular menstruation, unspecified: Secondary | ICD-10-CM | POA: Diagnosis not present

## 2023-11-21 DIAGNOSIS — N184 Chronic kidney disease, stage 4 (severe): Secondary | ICD-10-CM | POA: Diagnosis not present

## 2023-11-21 DIAGNOSIS — E1122 Type 2 diabetes mellitus with diabetic chronic kidney disease: Secondary | ICD-10-CM | POA: Diagnosis not present

## 2023-11-21 DIAGNOSIS — D631 Anemia in chronic kidney disease: Secondary | ICD-10-CM | POA: Diagnosis not present

## 2023-11-21 DIAGNOSIS — I129 Hypertensive chronic kidney disease with stage 1 through stage 4 chronic kidney disease, or unspecified chronic kidney disease: Secondary | ICD-10-CM | POA: Diagnosis not present

## 2023-11-21 DIAGNOSIS — N2581 Secondary hyperparathyroidism of renal origin: Secondary | ICD-10-CM | POA: Diagnosis not present

## 2023-11-21 DIAGNOSIS — R079 Chest pain, unspecified: Secondary | ICD-10-CM | POA: Diagnosis not present

## 2023-11-28 DIAGNOSIS — I10 Essential (primary) hypertension: Secondary | ICD-10-CM | POA: Diagnosis not present

## 2023-11-28 DIAGNOSIS — N184 Chronic kidney disease, stage 4 (severe): Secondary | ICD-10-CM | POA: Diagnosis not present

## 2023-11-28 DIAGNOSIS — I25118 Atherosclerotic heart disease of native coronary artery with other forms of angina pectoris: Secondary | ICD-10-CM | POA: Diagnosis not present

## 2023-11-28 DIAGNOSIS — Z6834 Body mass index (BMI) 34.0-34.9, adult: Secondary | ICD-10-CM | POA: Diagnosis not present

## 2023-11-28 DIAGNOSIS — Z720 Tobacco use: Secondary | ICD-10-CM | POA: Diagnosis not present

## 2023-11-28 DIAGNOSIS — E1169 Type 2 diabetes mellitus with other specified complication: Secondary | ICD-10-CM | POA: Diagnosis not present

## 2023-11-28 DIAGNOSIS — E785 Hyperlipidemia, unspecified: Secondary | ICD-10-CM | POA: Diagnosis not present

## 2023-12-12 DIAGNOSIS — E1122 Type 2 diabetes mellitus with diabetic chronic kidney disease: Secondary | ICD-10-CM | POA: Diagnosis not present

## 2023-12-12 DIAGNOSIS — E782 Mixed hyperlipidemia: Secondary | ICD-10-CM | POA: Diagnosis not present

## 2023-12-12 DIAGNOSIS — I2510 Atherosclerotic heart disease of native coronary artery without angina pectoris: Secondary | ICD-10-CM | POA: Diagnosis not present

## 2023-12-12 DIAGNOSIS — M109 Gout, unspecified: Secondary | ICD-10-CM | POA: Diagnosis not present

## 2023-12-12 DIAGNOSIS — E039 Hypothyroidism, unspecified: Secondary | ICD-10-CM | POA: Diagnosis not present

## 2023-12-15 ENCOUNTER — Other Ambulatory Visit (HOSPITAL_COMMUNITY): Payer: Self-pay | Admitting: *Deleted

## 2023-12-18 ENCOUNTER — Other Ambulatory Visit (HOSPITAL_COMMUNITY): Payer: Self-pay | Admitting: *Deleted

## 2023-12-19 ENCOUNTER — Inpatient Hospital Stay (HOSPITAL_COMMUNITY): Admission: RE | Admit: 2023-12-19 | Payer: Medicare HMO | Source: Ambulatory Visit

## 2023-12-22 ENCOUNTER — Other Ambulatory Visit: Payer: Self-pay

## 2023-12-22 ENCOUNTER — Telehealth: Payer: Self-pay

## 2023-12-22 DIAGNOSIS — D631 Anemia in chronic kidney disease: Secondary | ICD-10-CM | POA: Insufficient documentation

## 2023-12-22 DIAGNOSIS — E1169 Type 2 diabetes mellitus with other specified complication: Secondary | ICD-10-CM | POA: Diagnosis not present

## 2023-12-22 DIAGNOSIS — Z794 Long term (current) use of insulin: Secondary | ICD-10-CM | POA: Diagnosis not present

## 2023-12-22 DIAGNOSIS — E1122 Type 2 diabetes mellitus with diabetic chronic kidney disease: Secondary | ICD-10-CM | POA: Diagnosis not present

## 2023-12-22 DIAGNOSIS — I129 Hypertensive chronic kidney disease with stage 1 through stage 4 chronic kidney disease, or unspecified chronic kidney disease: Secondary | ICD-10-CM | POA: Diagnosis not present

## 2023-12-22 DIAGNOSIS — I25118 Atherosclerotic heart disease of native coronary artery with other forms of angina pectoris: Secondary | ICD-10-CM | POA: Diagnosis not present

## 2023-12-22 DIAGNOSIS — Z6834 Body mass index (BMI) 34.0-34.9, adult: Secondary | ICD-10-CM | POA: Diagnosis not present

## 2023-12-22 DIAGNOSIS — K7460 Unspecified cirrhosis of liver: Secondary | ICD-10-CM | POA: Diagnosis not present

## 2023-12-22 DIAGNOSIS — N184 Chronic kidney disease, stage 4 (severe): Secondary | ICD-10-CM | POA: Diagnosis not present

## 2023-12-22 DIAGNOSIS — I252 Old myocardial infarction: Secondary | ICD-10-CM | POA: Diagnosis not present

## 2023-12-22 NOTE — Telephone Encounter (Signed)
 Auth Submission: NO AUTH NEEDED Site of care: Site of care: [facility] Payer: [employer] medicare Medication & CPT/J Code(s) submitted: Ferrlecit (Ferric Gluconate) [id number] Route of submission (phone, fax, portal): portal Phone # Fax # Auth type: Buy/Bill PB Units/visits requested: 250mg , 4doses weekly Reference number:  Approval from: 12/22/23 to 12/11/24

## 2023-12-25 DIAGNOSIS — N184 Chronic kidney disease, stage 4 (severe): Secondary | ICD-10-CM | POA: Diagnosis not present

## 2023-12-25 DIAGNOSIS — D631 Anemia in chronic kidney disease: Secondary | ICD-10-CM | POA: Diagnosis not present

## 2023-12-25 DIAGNOSIS — I129 Hypertensive chronic kidney disease with stage 1 through stage 4 chronic kidney disease, or unspecified chronic kidney disease: Secondary | ICD-10-CM | POA: Diagnosis not present

## 2023-12-25 DIAGNOSIS — N2581 Secondary hyperparathyroidism of renal origin: Secondary | ICD-10-CM | POA: Diagnosis not present

## 2023-12-25 DIAGNOSIS — R079 Chest pain, unspecified: Secondary | ICD-10-CM | POA: Diagnosis not present

## 2023-12-25 DIAGNOSIS — N189 Chronic kidney disease, unspecified: Secondary | ICD-10-CM | POA: Diagnosis not present

## 2023-12-25 DIAGNOSIS — E1122 Type 2 diabetes mellitus with diabetic chronic kidney disease: Secondary | ICD-10-CM | POA: Diagnosis not present

## 2023-12-26 ENCOUNTER — Encounter (HOSPITAL_COMMUNITY): Payer: Medicare HMO

## 2023-12-29 ENCOUNTER — Encounter: Payer: Self-pay | Admitting: Nephrology

## 2023-12-29 DIAGNOSIS — I129 Hypertensive chronic kidney disease with stage 1 through stage 4 chronic kidney disease, or unspecified chronic kidney disease: Secondary | ICD-10-CM | POA: Diagnosis not present

## 2023-12-29 DIAGNOSIS — E1122 Type 2 diabetes mellitus with diabetic chronic kidney disease: Secondary | ICD-10-CM | POA: Diagnosis not present

## 2023-12-29 DIAGNOSIS — I25118 Atherosclerotic heart disease of native coronary artery with other forms of angina pectoris: Secondary | ICD-10-CM | POA: Diagnosis not present

## 2023-12-29 DIAGNOSIS — N189 Chronic kidney disease, unspecified: Secondary | ICD-10-CM | POA: Diagnosis not present

## 2023-12-29 DIAGNOSIS — I25110 Atherosclerotic heart disease of native coronary artery with unstable angina pectoris: Secondary | ICD-10-CM | POA: Diagnosis not present

## 2023-12-29 DIAGNOSIS — Z7982 Long term (current) use of aspirin: Secondary | ICD-10-CM | POA: Diagnosis not present

## 2023-12-30 DIAGNOSIS — Z9861 Coronary angioplasty status: Secondary | ICD-10-CM | POA: Diagnosis not present

## 2023-12-30 DIAGNOSIS — I2510 Atherosclerotic heart disease of native coronary artery without angina pectoris: Secondary | ICD-10-CM | POA: Diagnosis not present

## 2024-01-01 ENCOUNTER — Other Ambulatory Visit (HOSPITAL_COMMUNITY): Payer: Self-pay | Admitting: *Deleted

## 2024-01-02 ENCOUNTER — Other Ambulatory Visit: Payer: Self-pay

## 2024-01-02 ENCOUNTER — Telehealth: Payer: Self-pay | Admitting: Pharmacy Technician

## 2024-01-02 NOTE — Telephone Encounter (Signed)
Auth Submission: NO AUTH NEEDED Site of care: Site of care: [facility] Payer: [employer] MEDIC[employer] Medication & CPT/J Code(s) submitted: Venofer (Iron Sucrose) [id number] Route of submission (phone, fax, portal):  Phone # Fax # Auth type: Buy/Bill PB Units/visits requested: 2 Reference number:  Approval from: 01/02/24 to 06/01/24

## 2024-01-03 ENCOUNTER — Inpatient Hospital Stay (HOSPITAL_COMMUNITY): Admission: RE | Admit: 2024-01-03 | Payer: Medicare HMO | Source: Ambulatory Visit

## 2024-01-10 ENCOUNTER — Encounter (HOSPITAL_COMMUNITY): Payer: Medicare HMO

## 2024-01-12 DIAGNOSIS — I2510 Atherosclerotic heart disease of native coronary artery without angina pectoris: Secondary | ICD-10-CM | POA: Diagnosis not present

## 2024-01-12 DIAGNOSIS — M109 Gout, unspecified: Secondary | ICD-10-CM | POA: Diagnosis not present

## 2024-01-12 DIAGNOSIS — E1122 Type 2 diabetes mellitus with diabetic chronic kidney disease: Secondary | ICD-10-CM | POA: Diagnosis not present

## 2024-02-05 DIAGNOSIS — E1122 Type 2 diabetes mellitus with diabetic chronic kidney disease: Secondary | ICD-10-CM | POA: Diagnosis not present

## 2024-02-05 DIAGNOSIS — E039 Hypothyroidism, unspecified: Secondary | ICD-10-CM | POA: Diagnosis not present

## 2024-02-05 DIAGNOSIS — M25551 Pain in right hip: Secondary | ICD-10-CM | POA: Diagnosis not present

## 2024-02-05 DIAGNOSIS — I10 Essential (primary) hypertension: Secondary | ICD-10-CM | POA: Diagnosis not present

## 2024-02-05 DIAGNOSIS — I2510 Atherosclerotic heart disease of native coronary artery without angina pectoris: Secondary | ICD-10-CM | POA: Diagnosis not present

## 2024-02-05 DIAGNOSIS — D649 Anemia, unspecified: Secondary | ICD-10-CM | POA: Diagnosis not present

## 2024-02-05 DIAGNOSIS — M25552 Pain in left hip: Secondary | ICD-10-CM | POA: Diagnosis not present

## 2024-02-05 DIAGNOSIS — I252 Old myocardial infarction: Secondary | ICD-10-CM | POA: Diagnosis not present

## 2024-02-05 DIAGNOSIS — M109 Gout, unspecified: Secondary | ICD-10-CM | POA: Diagnosis not present

## 2024-02-05 DIAGNOSIS — R5383 Other fatigue: Secondary | ICD-10-CM | POA: Diagnosis not present

## 2024-02-07 DIAGNOSIS — N6459 Other signs and symptoms in breast: Secondary | ICD-10-CM | POA: Diagnosis not present

## 2024-02-07 DIAGNOSIS — R928 Other abnormal and inconclusive findings on diagnostic imaging of breast: Secondary | ICD-10-CM | POA: Diagnosis not present

## 2024-02-07 DIAGNOSIS — R92313 Mammographic fatty tissue density, bilateral breasts: Secondary | ICD-10-CM | POA: Diagnosis not present

## 2024-02-07 DIAGNOSIS — N6325 Unspecified lump in the left breast, overlapping quadrants: Secondary | ICD-10-CM | POA: Diagnosis not present

## 2024-02-07 DIAGNOSIS — N6321 Unspecified lump in the left breast, upper outer quadrant: Secondary | ICD-10-CM | POA: Diagnosis not present

## 2024-02-09 DIAGNOSIS — I2510 Atherosclerotic heart disease of native coronary artery without angina pectoris: Secondary | ICD-10-CM | POA: Diagnosis not present

## 2024-02-09 DIAGNOSIS — E1122 Type 2 diabetes mellitus with diabetic chronic kidney disease: Secondary | ICD-10-CM | POA: Diagnosis not present

## 2024-02-09 DIAGNOSIS — M109 Gout, unspecified: Secondary | ICD-10-CM | POA: Diagnosis not present

## 2024-02-13 DIAGNOSIS — N184 Chronic kidney disease, stage 4 (severe): Secondary | ICD-10-CM | POA: Diagnosis not present

## 2024-02-13 DIAGNOSIS — R079 Chest pain, unspecified: Secondary | ICD-10-CM | POA: Diagnosis not present

## 2024-02-13 DIAGNOSIS — N2581 Secondary hyperparathyroidism of renal origin: Secondary | ICD-10-CM | POA: Diagnosis not present

## 2024-02-13 DIAGNOSIS — I129 Hypertensive chronic kidney disease with stage 1 through stage 4 chronic kidney disease, or unspecified chronic kidney disease: Secondary | ICD-10-CM | POA: Diagnosis not present

## 2024-02-13 DIAGNOSIS — E1122 Type 2 diabetes mellitus with diabetic chronic kidney disease: Secondary | ICD-10-CM | POA: Diagnosis not present

## 2024-02-13 DIAGNOSIS — D631 Anemia in chronic kidney disease: Secondary | ICD-10-CM | POA: Diagnosis not present

## 2024-02-20 ENCOUNTER — Other Ambulatory Visit (HOSPITAL_COMMUNITY): Payer: Self-pay | Admitting: Nephrology

## 2024-02-20 DIAGNOSIS — R109 Unspecified abdominal pain: Secondary | ICD-10-CM

## 2024-02-27 DIAGNOSIS — R002 Palpitations: Secondary | ICD-10-CM | POA: Diagnosis not present

## 2024-02-27 DIAGNOSIS — Z720 Tobacco use: Secondary | ICD-10-CM | POA: Diagnosis not present

## 2024-02-27 DIAGNOSIS — E782 Mixed hyperlipidemia: Secondary | ICD-10-CM | POA: Diagnosis not present

## 2024-02-27 DIAGNOSIS — I25118 Atherosclerotic heart disease of native coronary artery with other forms of angina pectoris: Secondary | ICD-10-CM | POA: Diagnosis not present

## 2024-02-27 DIAGNOSIS — M109 Gout, unspecified: Secondary | ICD-10-CM | POA: Diagnosis not present

## 2024-02-27 DIAGNOSIS — I10 Essential (primary) hypertension: Secondary | ICD-10-CM | POA: Diagnosis not present

## 2024-03-11 DIAGNOSIS — I2510 Atherosclerotic heart disease of native coronary artery without angina pectoris: Secondary | ICD-10-CM | POA: Diagnosis not present

## 2024-03-11 DIAGNOSIS — E1122 Type 2 diabetes mellitus with diabetic chronic kidney disease: Secondary | ICD-10-CM | POA: Diagnosis not present

## 2024-03-11 DIAGNOSIS — M109 Gout, unspecified: Secondary | ICD-10-CM | POA: Diagnosis not present

## 2024-03-14 DIAGNOSIS — I491 Atrial premature depolarization: Secondary | ICD-10-CM | POA: Diagnosis not present

## 2024-03-14 DIAGNOSIS — I493 Ventricular premature depolarization: Secondary | ICD-10-CM | POA: Diagnosis not present

## 2024-03-14 DIAGNOSIS — R002 Palpitations: Secondary | ICD-10-CM | POA: Diagnosis not present

## 2024-03-22 DIAGNOSIS — E039 Hypothyroidism, unspecified: Secondary | ICD-10-CM | POA: Diagnosis not present

## 2024-03-22 DIAGNOSIS — E559 Vitamin D deficiency, unspecified: Secondary | ICD-10-CM | POA: Diagnosis not present

## 2024-03-22 DIAGNOSIS — I10 Essential (primary) hypertension: Secondary | ICD-10-CM | POA: Diagnosis not present

## 2024-03-22 DIAGNOSIS — R5383 Other fatigue: Secondary | ICD-10-CM | POA: Diagnosis not present

## 2024-03-22 DIAGNOSIS — M109 Gout, unspecified: Secondary | ICD-10-CM | POA: Diagnosis not present

## 2024-03-22 DIAGNOSIS — E785 Hyperlipidemia, unspecified: Secondary | ICD-10-CM | POA: Diagnosis not present

## 2024-03-22 DIAGNOSIS — I2510 Atherosclerotic heart disease of native coronary artery without angina pectoris: Secondary | ICD-10-CM | POA: Diagnosis not present

## 2024-03-22 DIAGNOSIS — D519 Vitamin B12 deficiency anemia, unspecified: Secondary | ICD-10-CM | POA: Diagnosis not present

## 2024-03-22 DIAGNOSIS — D529 Folate deficiency anemia, unspecified: Secondary | ICD-10-CM | POA: Diagnosis not present

## 2024-03-22 DIAGNOSIS — Z6833 Body mass index (BMI) 33.0-33.9, adult: Secondary | ICD-10-CM | POA: Diagnosis not present

## 2024-03-22 DIAGNOSIS — D649 Anemia, unspecified: Secondary | ICD-10-CM | POA: Diagnosis not present

## 2024-03-22 DIAGNOSIS — I252 Old myocardial infarction: Secondary | ICD-10-CM | POA: Diagnosis not present

## 2024-03-22 DIAGNOSIS — E1122 Type 2 diabetes mellitus with diabetic chronic kidney disease: Secondary | ICD-10-CM | POA: Diagnosis not present

## 2024-03-26 DIAGNOSIS — M67853 Other specified disorders of tendon, right hip: Secondary | ICD-10-CM | POA: Diagnosis not present

## 2024-03-26 DIAGNOSIS — M87052 Idiopathic aseptic necrosis of left femur: Secondary | ICD-10-CM | POA: Diagnosis not present

## 2024-03-26 DIAGNOSIS — M1612 Unilateral primary osteoarthritis, left hip: Secondary | ICD-10-CM | POA: Diagnosis not present

## 2024-03-26 DIAGNOSIS — S73192A Other sprain of left hip, initial encounter: Secondary | ICD-10-CM | POA: Diagnosis not present

## 2024-03-26 DIAGNOSIS — M25552 Pain in left hip: Secondary | ICD-10-CM | POA: Diagnosis not present

## 2024-03-26 DIAGNOSIS — M25452 Effusion, left hip: Secondary | ICD-10-CM | POA: Diagnosis not present

## 2024-03-26 DIAGNOSIS — M24852 Other specific joint derangements of left hip, not elsewhere classified: Secondary | ICD-10-CM | POA: Diagnosis not present

## 2024-04-02 DIAGNOSIS — D529 Folate deficiency anemia, unspecified: Secondary | ICD-10-CM | POA: Diagnosis not present

## 2024-04-02 DIAGNOSIS — D519 Vitamin B12 deficiency anemia, unspecified: Secondary | ICD-10-CM | POA: Diagnosis not present

## 2024-04-02 DIAGNOSIS — N184 Chronic kidney disease, stage 4 (severe): Secondary | ICD-10-CM | POA: Diagnosis not present

## 2024-04-02 DIAGNOSIS — D649 Anemia, unspecified: Secondary | ICD-10-CM | POA: Diagnosis not present

## 2024-04-10 DIAGNOSIS — I2510 Atherosclerotic heart disease of native coronary artery without angina pectoris: Secondary | ICD-10-CM | POA: Diagnosis not present

## 2024-04-10 DIAGNOSIS — M109 Gout, unspecified: Secondary | ICD-10-CM | POA: Diagnosis not present

## 2024-04-10 DIAGNOSIS — E1122 Type 2 diabetes mellitus with diabetic chronic kidney disease: Secondary | ICD-10-CM | POA: Diagnosis not present

## 2024-04-16 DIAGNOSIS — I2510 Atherosclerotic heart disease of native coronary artery without angina pectoris: Secondary | ICD-10-CM | POA: Diagnosis not present

## 2024-04-16 DIAGNOSIS — E1169 Type 2 diabetes mellitus with other specified complication: Secondary | ICD-10-CM | POA: Diagnosis not present

## 2024-04-16 DIAGNOSIS — I10 Essential (primary) hypertension: Secondary | ICD-10-CM | POA: Diagnosis not present

## 2024-04-16 DIAGNOSIS — E785 Hyperlipidemia, unspecified: Secondary | ICD-10-CM | POA: Diagnosis not present

## 2024-05-10 DIAGNOSIS — I2510 Atherosclerotic heart disease of native coronary artery without angina pectoris: Secondary | ICD-10-CM | POA: Diagnosis not present

## 2024-05-10 DIAGNOSIS — M109 Gout, unspecified: Secondary | ICD-10-CM | POA: Diagnosis not present

## 2024-05-10 DIAGNOSIS — E1122 Type 2 diabetes mellitus with diabetic chronic kidney disease: Secondary | ICD-10-CM | POA: Diagnosis not present

## 2024-05-20 DIAGNOSIS — N179 Acute kidney failure, unspecified: Secondary | ICD-10-CM | POA: Diagnosis not present

## 2024-05-20 DIAGNOSIS — I129 Hypertensive chronic kidney disease with stage 1 through stage 4 chronic kidney disease, or unspecified chronic kidney disease: Secondary | ICD-10-CM | POA: Diagnosis not present

## 2024-05-20 DIAGNOSIS — E7800 Pure hypercholesterolemia, unspecified: Secondary | ICD-10-CM | POA: Diagnosis not present

## 2024-05-20 DIAGNOSIS — E860 Dehydration: Secondary | ICD-10-CM | POA: Diagnosis not present

## 2024-05-20 DIAGNOSIS — E1122 Type 2 diabetes mellitus with diabetic chronic kidney disease: Secondary | ICD-10-CM | POA: Diagnosis not present

## 2024-05-20 DIAGNOSIS — N189 Chronic kidney disease, unspecified: Secondary | ICD-10-CM | POA: Diagnosis not present

## 2024-05-20 DIAGNOSIS — E039 Hypothyroidism, unspecified: Secondary | ICD-10-CM | POA: Diagnosis not present

## 2024-05-20 DIAGNOSIS — J45909 Unspecified asthma, uncomplicated: Secondary | ICD-10-CM | POA: Diagnosis not present

## 2024-05-20 DIAGNOSIS — Z79899 Other long term (current) drug therapy: Secondary | ICD-10-CM | POA: Diagnosis not present

## 2024-05-20 DIAGNOSIS — K529 Noninfective gastroenteritis and colitis, unspecified: Secondary | ICD-10-CM | POA: Diagnosis not present

## 2024-05-20 DIAGNOSIS — Z6829 Body mass index (BMI) 29.0-29.9, adult: Secondary | ICD-10-CM | POA: Diagnosis not present

## 2024-05-20 DIAGNOSIS — N184 Chronic kidney disease, stage 4 (severe): Secondary | ICD-10-CM | POA: Diagnosis not present

## 2024-05-20 DIAGNOSIS — A084 Viral intestinal infection, unspecified: Secondary | ICD-10-CM | POA: Diagnosis not present

## 2024-05-21 DIAGNOSIS — E1122 Type 2 diabetes mellitus with diabetic chronic kidney disease: Secondary | ICD-10-CM | POA: Diagnosis not present

## 2024-05-21 DIAGNOSIS — A084 Viral intestinal infection, unspecified: Secondary | ICD-10-CM | POA: Diagnosis not present

## 2024-05-21 DIAGNOSIS — Z79899 Other long term (current) drug therapy: Secondary | ICD-10-CM | POA: Diagnosis not present

## 2024-05-21 DIAGNOSIS — N184 Chronic kidney disease, stage 4 (severe): Secondary | ICD-10-CM | POA: Diagnosis not present

## 2024-05-21 DIAGNOSIS — N179 Acute kidney failure, unspecified: Secondary | ICD-10-CM | POA: Diagnosis not present

## 2024-05-22 DIAGNOSIS — N189 Chronic kidney disease, unspecified: Secondary | ICD-10-CM | POA: Diagnosis not present

## 2024-05-22 DIAGNOSIS — Z79899 Other long term (current) drug therapy: Secondary | ICD-10-CM | POA: Diagnosis not present

## 2024-05-22 DIAGNOSIS — N179 Acute kidney failure, unspecified: Secondary | ICD-10-CM | POA: Diagnosis not present

## 2024-05-22 DIAGNOSIS — E1122 Type 2 diabetes mellitus with diabetic chronic kidney disease: Secondary | ICD-10-CM | POA: Diagnosis not present

## 2024-05-22 NOTE — Discharge Summary (Signed)
 Hospitalist Discharge Summary   Discharge date:   May 22, 2024 Length of stay:    LOS: 2 days    Discharge Service:   [facility] Hospitalists Discharge Attending Physician: Casimiro Charlyne Seidel, MD Discharge to:    To Home Condition at Discharge:  good   Mental Status On day of Discharge:  The patient is Alert and oriented to PERSON The patient is Alert And oriented to TIME The patient is Alert and oriented to [facility] course based on timeline of significant events after admission (by date):  05/22/24: Patient has done well, OT pain has subsided.  Renal function is improving and diarrhea has stopped, I have advised that she continues hydrating copiously at home.  She has been discharged home in a stable and improved condition   05/21/24: Patient apparently had an acute gout flare overnight and started on allopurinol, colchicine and prednisone .  Renal function is slightly improving, serum creatinine is 4.81 this morning versus 5.39 on admission.  I will increase rate of IV fluids and continue to monitor.  Serum magnesium is slightly improved at 1.1, continue replacement _____________________________________   05/20/24 - started oral prednisone  40mg  daily for gout flare, allopurinol and colchicine contraindicated d/t renal function   ______________________________________   Admission HPI   Patient admitted on: 05/20/2024  8:32 AM  Patient admitted by: Casimiro Charlyne Seidel, MD  CHIEF COMPLAINT:  Diarrhea and vomiting  Day of admission HPI:   38 year old female with past medical history significant for type 2 diabetes, hypertension, obesity who presented to the emergency room today complaining of persistent nausea, vomiting and diarrhea x 3 days.  She apparently picked this up from her niece who has since gotten better.  But apparently a day after her niece came to visit she developed persistent vomiting then followed by diarrhea for multiple episodes per day.  She could not keep  anything down including rehydration salts and has not been able to eat in the last 2 days.  Patient denies abdominal pain, denies fever but admits shaking chills.  In the last 24 hours, she started to feel very dizzy and dehydrated and decided to come to the emergency room.   On presentation in the emergency room, blood pressure was 108/69, pulse was 94 and patient was afebrile.  Admitting labs were significant for BUN/creatinine of 56/5.39 (baseline creatinine is usually about 2.5).  WBC was normal, hemoglobin was 10.8.  Patient was resuscitated with 2 L of normal saline, started on Zofran  and referred for admission.  Patient admitted on Home O2? - no Patient on home anticoagulant? -  no Patient admitted with Chronic home foley catheter? - no Foley catheter placed or replaced by another service prior to admission? - no  Mental Status on Admission: The patient is Alert and oriented to PERSON The patient is Alert And oriented to TIME The patient is Alert and oriented to LOCATION   Problem List, Assessment & Plan    ASSESSMENT & PLAN (In order of descending acuity)  #AKI on CKD - Secondary to viral gastroenteritis - Has had gradual improvements of her kidney function although not entirely back to baseline - BUN/creatinine this morning is 46/3.78 versus 56/5.39 on admission 2 days ago - Diarrhea and nausea and vomiting has subsided  #Type 2 diabetes with CKD - A1c this visit is 6.7% - I have encouraged lifestyle improvement and dietary compliance - Patient admits to sedentary lifestyle  #Morbid obesity - Lifestyle changes advocated  #Hypomagnesemia - Now resolved  ADDITIONAL PATIENT FINDINGS OR OBSERVATIONS    DVT prophylaxis while in hospital:  pneumatic compression device    _____________________________________  Vital Signs: BP 131/73   Pulse 72   Temp 36.6 C (97.9 F) (Oral)   Resp 18   Ht 172.7 cm (5' 8)   Wt 87.6 kg (193 lb 3.2 oz)   SpO2 100%   BMI 29.38  kg/m   Nutrition:                                  Diet Instructions     Discharge diet (specify)     Discharge Nutrition Therapy: Consistent Carb   Consistent Carb Level: Consistent Carb 45/45/45 (3/3/3)       Patient does not meet AND/ASPEN criteria for malnutrition at this time (05/21/24 0900)              CODE STATUS :                    Full Code   Patient discharged on Home O2? - no Patient discharged on home anticoagulant? -  no Patient discharged with Chronic home foley catheter? - no  Time Spent on Discharge I spent greater than 30 minutes counseling and coordinating care for the discharge of this patient. The patient and I discussed the importance of outpatient follow-up as well as concerning signs and symptoms that would require immediate evaluation by a medical professional. The aforementioned conversation participants understand  and show insight. I used teachback to ensure understanding. The above participant/s is aware that not following the discussed plan, recommendations, and follow up can lead to severe negative effects on the patient's health, up to and including death.    Discharge Medications     Your Medication List     START taking these medications    varenicline tartrate 0.5 mg (11)- 1 mg (42) tablet Commonly known as: CHANTIX PAK Take one 0.5mg  tab once daily for 3 days,then increase to one 0.5mg  tab twice daily for 4 days,then increase to one 1mg  tab twice daily.   varenicline tartrate 1 mg tablet Commonly known as: CHANTIX Take 1 tablet (1 mg total) by mouth two (2) times a day.       CONTINUE taking these medications    ACCU-CHEK GUIDE GLUCOSE METER Misc Generic drug: blood-glucose meter Use as directed.   ACCU-CHEK GUIDE TEST STRIPS Strp Generic drug: blood sugar diagnostic Use 1 strips every day to monitor you blood sugars   ACCU-CHEK SOFTCLIX LANCETS lancets Generic drug: lancets Use every day to monitor you blood  sugars   acetaminophen  500 MG tablet Commonly known as: TYLENOL  Take 3 tablets (1,500 mg total) by mouth every six (6) hours as needed.   albuterol  90 mcg/actuation inhaler Commonly known as: PROVENTIL  HFA;VENTOLIN  HFA Inhale 2 puffs every four (4) hours as needed.   aspirin 81 MG tablet Commonly known as: ECOTRIN Take 1 tablet (81 mg total) by mouth in the morning.   atorvastatin 80 MG tablet Commonly known as: LIPITOR Take 1 tablet (80 mg total) by mouth in the morning.   BD NANO 2ND GEN PEN NEEDLE 32 gauge x 5/32 (4 mm) Ndle Generic drug: pen needle, diabetic USE 1 PEN NEEDLE DAILY   carvedilol 25 MG tablet Commonly known as: COREG Take 0.5 tablets (12.5 mg total) by mouth two (2) times a day.   cetirizine 10 MG chewable tablet Commonly  known as: ZYRTEC Chew 1 tablet (10 mg total) in the morning.   clopidogrel 75 mg tablet Commonly known as: PLAVIX Take 1 tablet (75 mg total) by mouth daily.   DULoxetine 60 MG capsule Commonly known as: CYMBALTA Take 1 capsule (60 mg total) by mouth in the morning.   famotidine 20 MG tablet Commonly known as: PEPCID Take 1 tablet (20 mg total) by mouth two (2) times a day for 15 days.   gabapentin 300 MG capsule Commonly known as: NEURONTIN Take 1 capsule (300 mg total) by mouth Three (3) times a day.   insulin  aspart 100 unit/mL vial Commonly known as: NovoLOG Inject under the skin Three (3) times a day before meals.   isosorbide mononitrate 60 MG 24 hr tablet Commonly known as: IMDUR Take 1 tablet (60 mg total) by mouth in the morning.   LANTUS SOLOSTAR U-100 INSULIN  100 unit/mL (3 mL) injection pen Generic drug: insulin  glargine   levothyroxine 200 MCG tablet Commonly known as: SYNTHROID Take 1 tablet (200 mcg total) by mouth every morning.   linaclotide 145 mcg capsule Commonly known as: LINZESS Take 1 capsule (145 mcg total) by mouth daily as needed.   lisinopril 10 MG tablet Commonly known as:  PRINIVIL,ZESTRIL Take 1 tablet (10 mg total) by mouth in the morning.   LORazepam 1 MG tablet Commonly known as: ATIVAN Take 1 tablet (1 mg total) by mouth in the morning.   MITIGARE 0.6 mg Cap capsule Generic drug: colchicine Take 1 capsule (0.6 mg total) by mouth in the morning.   ondansetron  8 MG tablet Commonly known as: ZOFRAN  Take 1 tablet (8 mg total) by mouth every eight (8) hours as needed for nausea.   pantoprazole  40 MG tablet Commonly known as: Protonix  Take 1 tablet (40 mg total) by mouth.   predniSONE  5 MG tablet Commonly known as: DELTASONE  Take 1 tablet (5 mg total) by mouth in the morning.       ____________________________________________  Discharge Instructions   Nutrition:                                  Diet Instructions     Discharge diet (specify)     Discharge Nutrition Therapy: Consistent Carb   Consistent Carb Level: Consistent Carb 45/45/45 (3/3/3)       Activity:                                   Activity Instructions     Activity as tolerated         Appointments:                         Appointments which have been scheduled for you    Jul 16, 2024 1:00 PM (Arrive by 12:30 PM) RETURN CARDIOLOGY with CARDIO [place] PROVIDER [facility] CARDIOLOGY AT [facility] [place]) [address] [place] [address] [phone]     Aug 19, 2024 2:40 PM MAMMO DIAGNOSTIC W CAD/TOMO BILATERAL with PIERCE MAMMO RM 1 IMG MAMMO WIC [facility]) [address] [place] [address] [phone]  Please Wear a two piece outfit, Do not wear deodorant, powder, oils or lotion on your chest and under arms.     Sep 05, 2024 2:45 PM (Arrive by 2:30  PM) NEW  RHEUMATOLOGY with Lavanda Ezzard Forte, MD [facility] RHEUMATOLOGY SPECIAL[facility] [place] [facility]) [address] [facility] [address] [place] [address] [phone]    Additional instructions:   [facility] WILL FOLLOW YOU AT HOME, IF YOU QUALIFY FOR THEIR SERVICES. THEY WILL CALL YOU WITH THEIR DECISION.      Follow Up:                              Follow Up instructions and Outpatient Referrals    Ambulatory Referral to Palliative Care     Reason for referral: Chronic Kidney Disease Stage 4, Asthma   Is this a palliative referral for:  symptom management coping support         Allergies  Allergies[1]    Past Medical History  Past Medical History[2]     Lab Results   Recent Labs    Units 05/20/24 0915 05/21/24 0553 05/22/24 0418  WBC 10*9/L 6.9 3.5* 5.2  HGB g/dL 89.1* 9.2* 8.4*  HCT % 32.4* 28.8* 26.1*  PLT 10*9/L 80* 64* 60*   Recent Labs    Units 05/20/24 0915 05/21/24 0553 05/21/24 1614 05/22/24 0418  NA mmol/L 138 137 140 146*  K mmol/L 3.8 4.7 4.8 4.3  CL mmol/L 106 108* 109* 114*  CO2 mmol/L 16.3* 13.4* 15.7* 17.0*  BUN mg/dL 56* 53* 52* 46*  CREATININE mg/dL 4.60* 5.18* 5.60* 6.21*  GLU mg/dL 895 757* 768* 841  CALCIUM mg/dL 7.8* 7.7* 7.6* 8.2*  ALBUMIN g/dL 3.0* 2.9* 2.8* 2.6*  PROT g/dL 6.9 6.9 6.6 6.3  BILITOT mg/dL 0.8 0.5 0.5 0.2*  AST U/L 12* 14* 10* 12*  ALT U/L 15 12 14 12   ALKPHOS U/L 108 114 110 117*  MG mg/dL 0.7* 1.1* 1.3* 1.8  PHOS mg/dL  --  3.1  --   --   LIPASE U/L 45  --   --   --     Recent Labs    Units 05/20/24 1314  A1C % 6.7*    Recent Labs    Units 05/20/24 1124  WBCUA /HPF 29*  NITRITE  Negative  LEUKOCYTESUR  Moderate*  BACTERIA /HPF Rare*  RBCUA /HPF 6*  BLOODU  Negative  GLUCOSEU  Negative  PROTEINUA  30 mg/dL*  KETONESU  Negative     Imaging  No results found.  Casimiro JAYSON Seidel, MD Hospitalist, [facility] 05/22/24, 7:57 AM     [1] Allergies Allergen Reactions  . Doxycycline Hives  . Sulfa (Sulfonamide Antibiotics) Hives  . Hydrochlorothiazide Other (See Comments)    Unsure of allergy  . Toradol  [Ketorolac ] Other (See Comments)    Kidney problems  [2] Past Medical  History: Diagnosis Date  . Anxiety   . Asthma (HHS-HCC)   . Cirrhosis of liver      . Cystitis   . Depression   . Disease of thyroid  gland   . GERD (gastroesophageal reflux disease)   . Gout 12/2017  . History of palpitations   . Hypercholesterolemia   . Hypertension   . Hypothyroidism   . Insomnia   . MI (myocardial infarction)      . Obese   . Polycystic ovary syndrome   . Sciatica   . Stage 4 chronic kidney disease      . Type 2 diabetes mellitus

## 2024-05-28 DIAGNOSIS — N179 Acute kidney failure, unspecified: Secondary | ICD-10-CM | POA: Diagnosis not present

## 2024-05-28 DIAGNOSIS — Z720 Tobacco use: Secondary | ICD-10-CM | POA: Diagnosis not present

## 2024-05-28 DIAGNOSIS — D649 Anemia, unspecified: Secondary | ICD-10-CM | POA: Diagnosis not present

## 2024-05-28 DIAGNOSIS — E1129 Type 2 diabetes mellitus with other diabetic kidney complication: Secondary | ICD-10-CM | POA: Diagnosis not present

## 2024-05-28 DIAGNOSIS — A084 Viral intestinal infection, unspecified: Secondary | ICD-10-CM | POA: Diagnosis not present

## 2024-06-10 DIAGNOSIS — M109 Gout, unspecified: Secondary | ICD-10-CM | POA: Diagnosis not present

## 2024-06-10 DIAGNOSIS — I2510 Atherosclerotic heart disease of native coronary artery without angina pectoris: Secondary | ICD-10-CM | POA: Diagnosis not present

## 2024-06-10 DIAGNOSIS — E1122 Type 2 diabetes mellitus with diabetic chronic kidney disease: Secondary | ICD-10-CM | POA: Diagnosis not present

## 2024-06-11 DIAGNOSIS — N2581 Secondary hyperparathyroidism of renal origin: Secondary | ICD-10-CM | POA: Diagnosis not present

## 2024-06-11 DIAGNOSIS — N184 Chronic kidney disease, stage 4 (severe): Secondary | ICD-10-CM | POA: Diagnosis not present

## 2024-06-11 DIAGNOSIS — I129 Hypertensive chronic kidney disease with stage 1 through stage 4 chronic kidney disease, or unspecified chronic kidney disease: Secondary | ICD-10-CM | POA: Diagnosis not present

## 2024-06-11 DIAGNOSIS — I2510 Atherosclerotic heart disease of native coronary artery without angina pectoris: Secondary | ICD-10-CM | POA: Diagnosis not present

## 2024-06-11 DIAGNOSIS — D631 Anemia in chronic kidney disease: Secondary | ICD-10-CM | POA: Diagnosis not present

## 2024-06-11 DIAGNOSIS — E1122 Type 2 diabetes mellitus with diabetic chronic kidney disease: Secondary | ICD-10-CM | POA: Diagnosis not present

## 2024-06-13 DIAGNOSIS — M25561 Pain in right knee: Secondary | ICD-10-CM | POA: Diagnosis not present

## 2024-06-13 DIAGNOSIS — Z6832 Body mass index (BMI) 32.0-32.9, adult: Secondary | ICD-10-CM | POA: Diagnosis not present

## 2024-06-13 DIAGNOSIS — F331 Major depressive disorder, recurrent, moderate: Secondary | ICD-10-CM | POA: Diagnosis not present

## 2024-06-18 DIAGNOSIS — D559 Anemia due to enzyme disorder, unspecified: Secondary | ICD-10-CM | POA: Diagnosis not present

## 2024-06-18 DIAGNOSIS — D649 Anemia, unspecified: Secondary | ICD-10-CM | POA: Diagnosis not present

## 2024-06-18 DIAGNOSIS — M25561 Pain in right knee: Secondary | ICD-10-CM | POA: Diagnosis not present

## 2024-06-18 DIAGNOSIS — M109 Gout, unspecified: Secondary | ICD-10-CM | POA: Diagnosis not present

## 2024-06-18 DIAGNOSIS — Z6832 Body mass index (BMI) 32.0-32.9, adult: Secondary | ICD-10-CM | POA: Diagnosis not present

## 2024-06-18 DIAGNOSIS — R5383 Other fatigue: Secondary | ICD-10-CM | POA: Diagnosis not present

## 2024-06-18 DIAGNOSIS — D519 Vitamin B12 deficiency anemia, unspecified: Secondary | ICD-10-CM | POA: Diagnosis not present

## 2024-06-18 DIAGNOSIS — N184 Chronic kidney disease, stage 4 (severe): Secondary | ICD-10-CM | POA: Diagnosis not present

## 2024-06-21 DIAGNOSIS — Z720 Tobacco use: Secondary | ICD-10-CM | POA: Diagnosis not present

## 2024-06-21 DIAGNOSIS — E1129 Type 2 diabetes mellitus with other diabetic kidney complication: Secondary | ICD-10-CM | POA: Diagnosis not present

## 2024-06-21 DIAGNOSIS — N179 Acute kidney failure, unspecified: Secondary | ICD-10-CM | POA: Diagnosis not present

## 2024-06-21 DIAGNOSIS — A084 Viral intestinal infection, unspecified: Secondary | ICD-10-CM | POA: Diagnosis not present

## 2024-07-08 ENCOUNTER — Encounter: Payer: Self-pay | Admitting: Nephrology

## 2024-07-11 DIAGNOSIS — M109 Gout, unspecified: Secondary | ICD-10-CM | POA: Diagnosis not present

## 2024-07-11 DIAGNOSIS — I2510 Atherosclerotic heart disease of native coronary artery without angina pectoris: Secondary | ICD-10-CM | POA: Diagnosis not present

## 2024-07-11 DIAGNOSIS — E1122 Type 2 diabetes mellitus with diabetic chronic kidney disease: Secondary | ICD-10-CM | POA: Diagnosis not present

## 2024-07-15 ENCOUNTER — Inpatient Hospital Stay

## 2024-07-15 ENCOUNTER — Inpatient Hospital Stay: Admitting: Hematology

## 2024-07-15 NOTE — Progress Notes (Incomplete)
 [name] [address] [place] [address]   Clinic Day:  07/15/2024  Referring physician: [name], MD  Patient Care Team: Angela Jerel MATSU, MD as PCP - General (Family Medicine) Angela Carlin POUR, DO as Consulting Physician (Gastroenterology)   ASSESSMENT & PLAN:   Assessment: ***  Plan: ***  No orders of the defined types were placed in this encounter.     Angela Baker,acting as a Neurosurgeon for Angela Stands, MD.,have documented all relevant documentation on the behalf of Angela Stands, MD,as directed by  Angela Stands, MD while in the presence of Angela Stands, MD.   ***  Angela Baker   8/4/20258:25 AM  CHIEF COMPLAINT/PURPOSE OF CONSULT:   Diagnosis: ***  Current Therapy:  ***  HISTORY OF PRESENT ILLNESS:   Angela Baker is a 38 y.o. female presenting to clinic today for evaluation of anemia and thrombocytopenia at the request of Angela Fallow, PA-C.  Patient has a medical history of DM type II, CAD (s/p LAD PCI on 12/2023), hypertension, hypothyroidism, hyperlipidemia, CKD, depression, anxiety, and gout.  Angela Baker was seen by her PCP at [facility] on 06/18/2024 for right knee pain. She had CBC diff done on the same day that found low HGB at 11.0, low HCT at 33.5, low platelets at 98, ans elevated granulocytes at 6.2.  Vitamin D level from 06/18/2024 was low at 4.2. Iron and ferritin panel from 06/11/2024 showed normal ferritin at 49 and iron saturation at the lower end of normal at 19%. Vitamin B 12 level from 06/18/2024 was elevated at >2000. Her anemia has slightly improved since 05/28/2024, as her HGB was 9.8 and RBC was low at 3.85 at that time.   Today, she states that she is doing well overall. Her appetite level is at ***%. Her energy level is at ***%.  PAST MEDICAL HISTORY:   Past Medical History: Past Medical History:  Diagnosis Date   Anxiety    CKD (chronic kidney disease) stage 4, GFR 15-29 ml/min (HCC)     Depression    Gout    Hyperlipidemia    Hypertension    Hypothyroidism    Type 2 diabetes mellitus (HCC)     Surgical History: No past surgical history on file.  Social History: Social History   Socioeconomic History   Marital status: Single    Spouse name: Not on file   Number of children: Not on file   Years of education: Not on file   Highest education level: Not on file  Occupational History   Not on file  Tobacco Use   Smoking status: Former    Types: Cigarettes   Smokeless tobacco: Never  Substance and Sexual Activity   Alcohol  use: Not Currently   Drug use: Not Currently    Types: Marijuana   Sexual activity: Not on file  Other Topics Concern   Not on file  Social History Narrative   Not on file   Social Drivers of Health   Financial Resource Strain: Medium Risk (02/03/2023)   Received from [facility]   Overall Financial Resource Strain (CARDIA)    Difficulty of Paying Living Expenses: Somewhat hard  Food Insecurity: No Food Insecurity (06/25/2023)   Received from [facility]   Hunger Vital Sign    Within the past 12 months, you worried that your food would run out before you got the money to buy more.: Never true    Within the past 12 months, the  food you bought just didn't last and you didn't have money to get more.: Never true  Transportation Needs: No Transportation Needs (06/25/2023)   Received from [facility]   PRAPARE - Transportation    Lack of Transportation (Medical): No    Lack of Transportation (Non-Medical): No  Physical Activity: Not on file  Stress: Stress Concern Present (06/23/2023)   Received from [facility] - Occupational Stress Questionnaire    Feeling of Stress : To some extent  Social Connections: Unknown (04/18/2022)   Received from [facility]   Social Network    Social Network: Not on file  Intimate Partner Violence: Not At Risk (10/19/2023)   Received from [facility]    Humiliation, Afraid, Rape, and Kick questionnaire    Within the last year, have you been afraid of your partner or ex-partner?: No    Within the last year, have you been humiliated or emotionally abused in other ways by your partner or ex-partner?: No    Within the last year, have you been kicked, hit, slapped, or otherwise physically hurt by your partner or ex-partner?: No    Within the last year, have you been raped or forced to have any kind of sexual activity by your partner or ex-partner?: No    Family History: Family History  Problem Relation Age of Onset   Breast cancer Mother    Hypertension Mother    Diabetes Father    Hypertension Father    Heart disease Father     Current Medications:  Current Outpatient Medications:    amLODipine (NORVASC) 10 MG tablet, Take 10 mg by mouth daily., Disp: , Rfl:    atorvastatin (LIPITOR) 10 MG tablet, Take 10 mg by mouth daily., Disp: , Rfl:    Cholecalciferol (VITAMIN D3) 50 MCG (2000 UT) capsule, Take 2,000 Units by mouth daily., Disp: , Rfl:    colchicine 0.6 MG tablet, Take 0.6 mg by mouth daily. (Patient not taking: Reported on 10/24/2022), Disp: , Rfl:    Cyanocobalamin  (VITAMIN B12) 1000 MCG TBCR, Take 1 tablet by mouth daily., Disp: , Rfl:    Dulaglutide (TRULICITY) 0.75 MG/0.5ML SOPN, Inject into the skin once a week., Disp: , Rfl:    DULoxetine (CYMBALTA) 60 MG capsule, Take 60 mg by mouth daily., Disp: , Rfl:    labetalol (NORMODYNE) 200 MG tablet, Take 200 mg by mouth daily., Disp: , Rfl:    LEVOTHYROXINE SODIUM PO, Take 275 mcg by mouth., Disp: , Rfl:    LORazepam (ATIVAN) 1 MG tablet, Take 1 mg by mouth every 8 (eight) hours., Disp: , Rfl:    MITIGARE 0.6 MG CAPS, Take 1 capsule by mouth daily., Disp: , Rfl:    ondansetron  (ZOFRAN -ODT) 8 MG disintegrating tablet, Take 1 tablet (8 mg total) by mouth every 8 (eight) hours., Disp: 20 tablet, Rfl: 1   pantoprazole  (PROTONIX ) 40 MG tablet, Take 1 tablet (40 mg total) by mouth 2  (two) times daily., Disp: 60 tablet, Rfl: 3   Allergies: Allergies  Allergen Reactions   Doxycycline    Other     HCTZ - DEHYDRATION    Prednisone     Sulfa Antibiotics     REVIEW OF SYSTEMS:   Review of Systems  Constitutional:  Negative for chills, fatigue and fever.  HENT:   Negative for lump/mass, mouth sores, nosebleeds, sore throat and trouble swallowing.   Eyes:  Negative for eye problems.  Respiratory:  Negative for  cough and shortness of breath.   Cardiovascular:  Negative for chest pain, leg swelling and palpitations.  Gastrointestinal:  Negative for abdominal pain, constipation, diarrhea, nausea and vomiting.  Genitourinary:  Negative for bladder incontinence, difficulty urinating, dysuria, frequency, hematuria and nocturia.   Musculoskeletal:  Negative for arthralgias, back pain, flank pain, myalgias and neck pain.  Skin:  Negative for itching and rash.  Neurological:  Negative for dizziness, headaches and numbness.  Hematological:  Does not bruise/bleed easily.  Psychiatric/Behavioral:  Negative for depression, sleep disturbance and suicidal ideas. The patient is not nervous/anxious.   All other systems reviewed and are negative.    VITALS:   There were no vitals taken for this visit.  Wt Readings from Last 3 Encounters:  10/24/22 229 lb 3.2 oz (104 kg)    There is no height or weight on file to calculate BMI.   PHYSICAL EXAM:   Physical Exam Vitals and nursing note reviewed. Exam conducted with a chaperone present.  Constitutional:      Appearance: Normal appearance.  Cardiovascular:     Rate and Rhythm: Normal rate and regular rhythm.     Pulses: Normal pulses.     Heart sounds: Normal heart sounds.  Pulmonary:     Effort: Pulmonary effort is normal.     Breath sounds: Normal breath sounds.  Abdominal:     Palpations: Abdomen is soft. There is no hepatomegaly, splenomegaly or mass.     Tenderness: There is no abdominal tenderness.  Musculoskeletal:      Right lower leg: No edema.     Left lower leg: No edema.  Lymphadenopathy:     Cervical: No cervical adenopathy.     Right cervical: No superficial, deep or posterior cervical adenopathy.    Left cervical: No superficial, deep or posterior cervical adenopathy.     Upper Body:     Right upper body: No supraclavicular or axillary adenopathy.     Left upper body: No supraclavicular or axillary adenopathy.  Neurological:     General: No focal deficit present.     Mental Status: She is alert and oriented to person, place, and time.  Psychiatric:        Mood and Affect: Mood normal.        Behavior: Behavior normal.     LABS:   CBC    Component Value Date/Time   WBC 13.6 (H) 10/24/2022 1337   RBC 4.92 10/24/2022 1337   HGB 14.2 10/24/2022 1337   HCT 41.9 10/24/2022 1337   PLT 163 10/24/2022 1337   MCV 85 10/24/2022 1337   MCH 28.9 10/24/2022 1337   MCHC 33.9 10/24/2022 1337   RDW 14.5 10/24/2022 1337    CMP    Component Value Date/Time   NA 139 10/24/2022 1337   K 4.1 10/24/2022 1337   CL 101 10/24/2022 1337   CO2 16 (L) 10/24/2022 1337   GLUCOSE 121 (H) 10/24/2022 1337   BUN 32 (H) 10/24/2022 1337   CREATININE 3.27 (H) 10/24/2022 1337   CALCIUM 10.1 10/24/2022 1337   PROT 8.2 10/24/2022 1337   ALBUMIN 4.8 10/24/2022 1337   AST 18 10/24/2022 1337   ALT 11 10/24/2022 1337   ALKPHOS 202 (H) 10/24/2022 1337   BILITOT 0.8 10/24/2022 1337    No results found for: CEA1, CEA / No results found for: CEA1, CEA No results found for: PSA1 No results found for: CAN199 No results found for: CAN125  No results found for: TOTALPROTELP, ALBUMINELP, A1GS,  A2GS, BETS, BETA2SER, GAMS, MSPIKE, SPEI No results found for: TIBC, FERRITIN, IRONPCTSAT No results found for: LDH   STUDIES:   No results found.

## 2024-07-22 ENCOUNTER — Inpatient Hospital Stay: Admitting: Oncology

## 2024-07-22 ENCOUNTER — Inpatient Hospital Stay: Attending: Hematology and Oncology | Admitting: Hematology and Oncology

## 2024-07-22 ENCOUNTER — Encounter: Payer: Self-pay | Admitting: Hematology and Oncology

## 2024-07-22 VITALS — BP 128/95 | HR 88 | Temp 98.0°F | Resp 20 | Ht 67.91 in | Wt 220.9 lb

## 2024-07-22 DIAGNOSIS — K7581 Nonalcoholic steatohepatitis (NASH): Secondary | ICD-10-CM | POA: Insufficient documentation

## 2024-07-22 DIAGNOSIS — Z794 Long term (current) use of insulin: Secondary | ICD-10-CM | POA: Diagnosis not present

## 2024-07-22 DIAGNOSIS — K7460 Unspecified cirrhosis of liver: Secondary | ICD-10-CM | POA: Insufficient documentation

## 2024-07-22 DIAGNOSIS — N184 Chronic kidney disease, stage 4 (severe): Secondary | ICD-10-CM | POA: Insufficient documentation

## 2024-07-22 DIAGNOSIS — Z803 Family history of malignant neoplasm of breast: Secondary | ICD-10-CM | POA: Diagnosis not present

## 2024-07-22 DIAGNOSIS — E1122 Type 2 diabetes mellitus with diabetic chronic kidney disease: Secondary | ICD-10-CM | POA: Insufficient documentation

## 2024-07-22 DIAGNOSIS — Z79899 Other long term (current) drug therapy: Secondary | ICD-10-CM | POA: Diagnosis not present

## 2024-07-22 DIAGNOSIS — Z79620 Long term (current) use of immunosuppressive biologic: Secondary | ICD-10-CM | POA: Diagnosis not present

## 2024-07-22 DIAGNOSIS — D696 Thrombocytopenia, unspecified: Secondary | ICD-10-CM | POA: Insufficient documentation

## 2024-07-22 DIAGNOSIS — D631 Anemia in chronic kidney disease: Secondary | ICD-10-CM | POA: Diagnosis not present

## 2024-07-22 DIAGNOSIS — M6250 Muscle wasting and atrophy, not elsewhere classified, unspecified site: Secondary | ICD-10-CM | POA: Diagnosis not present

## 2024-07-22 DIAGNOSIS — E119 Type 2 diabetes mellitus without complications: Secondary | ICD-10-CM | POA: Insufficient documentation

## 2024-07-22 DIAGNOSIS — Z87891 Personal history of nicotine dependence: Secondary | ICD-10-CM | POA: Insufficient documentation

## 2024-07-22 LAB — CMP (CANCER CENTER ONLY)
ALT: 14 U/L (ref 0–44)
AST: 10 U/L — ABNORMAL LOW (ref 15–41)
Albumin: 3.7 g/dL (ref 3.5–5.0)
Alkaline Phosphatase: 141 U/L — ABNORMAL HIGH (ref 38–126)
Anion gap: 12 (ref 5–15)
BUN: 58 mg/dL — ABNORMAL HIGH (ref 6–20)
CO2: 18 mmol/L — ABNORMAL LOW (ref 22–32)
Calcium: 8.4 mg/dL — ABNORMAL LOW (ref 8.9–10.3)
Chloride: 108 mmol/L (ref 98–111)
Creatinine: 2.97 mg/dL — ABNORMAL HIGH (ref 0.44–1.00)
GFR, Estimated: 20 mL/min — ABNORMAL LOW (ref >60)
Glucose, Bld: 179 mg/dL — ABNORMAL HIGH (ref 70–99)
Potassium: 4.2 mmol/L (ref 3.5–5.1)
Sodium: 138 mmol/L (ref 135–145)
Total Bilirubin: 0.7 mg/dL (ref 0.0–1.2)
Total Protein: 7.3 g/dL (ref 6.5–8.1)

## 2024-07-22 LAB — CBC WITH DIFFERENTIAL/PLATELET
Abs Immature Granulocytes: 0.14 K/uL — ABNORMAL HIGH (ref 0.00–0.07)
Basophils Absolute: 0 K/uL (ref 0.0–0.1)
Basophils Relative: 0 %
Eosinophils Absolute: 0.1 K/uL (ref 0.0–0.5)
Eosinophils Relative: 1 %
HCT: 38.3 % (ref 36.0–46.0)
Hemoglobin: 12.1 g/dL (ref 12.0–15.0)
Immature Granulocytes: 1 %
Lymphocytes Relative: 45 %
Lymphs Abs: 6.6 K/uL — ABNORMAL HIGH (ref 0.7–4.0)
MCH: 26.9 pg (ref 26.0–34.0)
MCHC: 31.6 g/dL (ref 30.0–36.0)
MCV: 85.1 fL (ref 80.0–100.0)
Monocytes Absolute: 0.8 K/uL (ref 0.1–1.0)
Monocytes Relative: 5 %
Neutro Abs: 7.1 K/uL (ref 1.7–7.7)
Neutrophils Relative %: 48 %
Platelets: 120 K/uL — ABNORMAL LOW (ref 150–400)
RBC: 4.5 MIL/uL (ref 3.87–5.11)
RDW: 15.5 % (ref 11.5–15.5)
Smear Review: NORMAL
WBC: 14.8 K/uL — ABNORMAL HIGH (ref 4.0–10.5)
nRBC: 0 % (ref 0.0–0.2)

## 2024-07-22 LAB — RETICULOCYTES
Immature Retic Fract: 24.5 % — ABNORMAL HIGH (ref 2.3–15.9)
RBC.: 4.43 MIL/uL (ref 3.87–5.11)
Retic Count, Absolute: 130.7 K/uL (ref 19.0–186.0)
Retic Ct Pct: 3 % (ref 0.4–3.1)

## 2024-07-22 LAB — TSH: TSH: 0.64 u[IU]/mL (ref 0.350–4.500)

## 2024-07-22 LAB — VITAMIN B12: Vitamin B-12: 2633 pg/mL — ABNORMAL HIGH (ref 180–914)

## 2024-07-22 LAB — IRON AND TIBC
Iron: 50 ug/dL (ref 28–170)
Saturation Ratios: 15 % (ref 10.4–31.8)
TIBC: 346 ug/dL (ref 250–450)
UIBC: 296 ug/dL

## 2024-07-22 LAB — SEDIMENTATION RATE: Sed Rate: 10 mm/h (ref 0–20)

## 2024-07-22 LAB — FERRITIN: Ferritin: 20 ng/mL (ref 11–307)

## 2024-07-22 NOTE — Assessment & Plan Note (Signed)
 Likely due to liver cirrhosis She does not need further workup or follow-up in this regard

## 2024-07-22 NOTE — Progress Notes (Signed)
 [facility] CONSULT NOTE  Patient Care Team: [name], MD as PCP - General (Family Medicine) Cindie Carlin POUR, DO as Consulting Physician (Gastroenterology)  ASSESSMENT & PLAN:  Anemia of chronic renal failure The cause of her anemia is multifactorial but most likely due to anemia chronic kidney disease and poorly controlled diabetes I will order additional workup as well as test to rule out paraproteinemia I will see her next week If she qualifies, we will order erythropoietin  stimulating agent  Type 2 diabetes mellitus (HCC) She has poorly controlled diabetes with poor dietary choices We discussed importance of frequent small meals and higher protein intake  CKD (chronic kidney disease) stage 4, GFR 15-29 ml/min (HCC) She has severe chronic kidney disease stage IV due to poor oral fluid hydration and diabetes I recommend the patient to increase oral fluid intake as tolerated  Muscle wasting She has signs of muscle wasting and sarcopenia We discussed importance of frequent small meals and higher protein intake  Thrombocytopenia (HCC) Likely due to liver cirrhosis She does not need further workup or follow-up in this regard  Liver cirrhosis secondary to NASH (nonalcoholic steatohepatitis) (HCC) Her MRI showed evidence of liver cirrhosis and splenomegaly, likely due to NASH Orders Placed This Encounter  Procedures   CMP (Cancer Center only)    Standing Status:   Future    Number of Occurrences:   1    Expiration Date:   07/22/2025   CBC with Differential (Cancer Center Only)    Standing Status:   Future    Number of Occurrences:   1    Expiration Date:   07/22/2025   Ferritin    Standing Status:   Future    Number of Occurrences:   1    Expiration Date:   07/22/2025   Iron and Iron Binding Capacity ([facility] only)    Standing Status:   Future    Number of Occurrences:   1    Expiration Date:   07/22/2025   TSH    Standing Status:   Future    Number  of Occurrences:   1    Expiration Date:   07/22/2025   Vitamin B12    Standing Status:   Future    Number of Occurrences:   1    Expiration Date:   07/22/2025   Reticulocytes    Standing Status:   Future    Number of Occurrences:   1    Expiration Date:   07/22/2025   Sedimentation rate    Standing Status:   Future    Number of Occurrences:   1    Expiration Date:   07/22/2025   Kappa/lambda light chains    Standing Status:   Future    Number of Occurrences:   1    Expiration Date:   07/22/2025   Multiple Myeloma Panel (SPEP&IFE w/QIG)    Standing Status:   Future    Number of Occurrences:   1    Expiration Date:   07/22/2025   Erythropoietin     Standing Status:   Future    Number of Occurrences:   1    Expiration Date:   07/22/2025    All questions were answered. The patient knows to call the clinic with any problems, questions or concerns.  The total time spent in the appointment was 60 minutes encounter with patients including review of chart and various tests results, discussions about plan of care and coordination of  care plan  Almarie Bedford, MD 8/11/20252:45 PM   CHIEF COMPLAINTS/PURPOSE OF CONSULTATION:  Anemia  HISTORY OF PRESENTING ILLNESS:  Angela Baker 38 y.o. female is here because of anemia  She was found to have abnormal CBC from recent blood work I have the opportunity to review blood count from [facility] as well as electronic chart review I also reviewed information from her primary care doctor notes On June 11, 2024, B12 level was high.  TIBC is 258, serum iron 50, iron saturation 19% Ferritin level was 49.  CBC showed hemoglobin of 10.3 with platelet 108 On April 02, 2024, repeat B12 level was borderline low at 262, folic acid  is low at 2.2, hemoglobin 10.2 and platelet count 131, creatinine is 3 On February 13, 2024, ferritin was 34, CBC 10.7, platelet count 100 On February 05, 2024, hemoglobin 11.2, platelet count 101 On October 18, 2023, hemoglobin 8.7, vitamin  B12 331 and ferritin of 54, normal folate On June 16, 2023, hemoglobin 10.9, platelet count 156, creatinine 2.2 On Apr 21, 2023, hemoglobin 12.1, platelet count 126, creatinine 2.3 On January 25, 2023, hemoglobin 10.1, platelet count 201 On January 11, 2023, hemoglobin 11.7, platelet count 115 On November 16, 2022, hemoglobin 11.5 and platelet count 132 On October 24, 2022, hemoglobin 14.2 and platelet count 163, creatinine of 3.27, elevated alkaline phosphatase of 202, gamma GT of 177, white blood cell count of 13.6, hemoglobin 14.2 and platelet count of 163 Renal ultrasound from May 26, 2022 showed complex kidney masses MRI from June 24, 2022 showed hepatomegaly, evidence of hepatic steatosis as well as abnormality suggesting of cirrhosis.  She has cysts in her liver and was also noted to have enlarged spleen.  She has bilateral renal cyst but no suspicious renal masses  She denies recent chest pain on exertion, shortness of breath on minimal exertion, pre-syncopal episodes, or palpitations.  However, she has significant fatigue and sleep constantly.  She has recurrent gout.  She had recent hospitalization in June 2015 with signs of acute renal failure, severe gout and anemia Earlier this year, she received 1 dose of intravenous iron She recalls receiving blood not long ago  She had not noticed any recent bleeding such as epistaxis, hematuria or hematochezia She used to have significant menorrhagia but has not have any menstrual cycle for 6 to 12 months.  She used to bleed for almost 2 weeks with passage of large amount of clots  The patient denies over the counter NSAID ingestion. She is on antiplatelets agents.  She had no prior history or diagnosis of cancer. Her age appropriate screening programs are up-to-date. She denies any pica and eats a variety of diet.  However, in going through her diet history, it appears that she has poor appetite in general and only eat 1 meal.  She gets  progressively weaker in time and have difficulties mobilizing as she felt that her legs are about to buckle under her weight She never donated blood  She has been diagnosed with diabetes since around the age of 62 or 40 and in the last 2 to 3 years has been placed on insulin   MEDICAL HISTORY:  Past Medical History:  Diagnosis Date   Anxiety    CKD (chronic kidney disease) stage 4, GFR 15-29 ml/min (HCC)    Depression    Gout    Hyperlipidemia    Hypertension    Hypothyroidism    Liver cirrhosis secondary to NASH (nonalcoholic steatohepatitis) (HCC)    Type  2 diabetes mellitus (HCC)     SURGICAL HISTORY: History reviewed. No pertinent surgical history.  SOCIAL HISTORY: Social History   Socioeconomic History   Marital status: Married    Spouse name: Not on file   Number of children: Not on file   Years of education: Not on file   Highest education level: Not on file  Occupational History   Not on file  Tobacco Use   Smoking status: Former    Types: Cigarettes   Smokeless tobacco: Never  Substance and Sexual Activity   Alcohol  use: Not Currently   Drug use: Not Currently    Types: Marijuana   Sexual activity: Not on file  Other Topics Concern   Not on file  Social History Narrative   Not on file   Social Drivers of Health   Financial Resource Strain: Medium Risk (02/03/2023)   Received from [facility]   Overall Financial Resource Strain (CARDIA)    Difficulty of Paying Living Expenses: Somewhat hard  Food Insecurity: No Food Insecurity (07/22/2024)   Hunger Vital Sign    Worried About Running Out of Food in the Last Year: Never true    Ran Out of Food in the Last Year: Never true  Transportation Needs: No Transportation Needs (07/22/2024)   PRAPARE - Administrator, Civil Service (Medical): No    Lack of Transportation (Non-Medical): No  Physical Activity: Not on file  Stress: Stress Concern Present (06/23/2023)   Received from [facility] - Occupational Stress Questionnaire    Feeling of Stress : To some extent  Social Connections: Unknown (04/18/2022)   [facility] from [facility]   Social Network    Social Network: Not on file  Intimate Partner Violence: Not At Risk (07/22/2024)   Humiliation, Afraid, Rape, and Kick questionnaire    Fear of Current or Ex-Partner: No    Emotionally Abused: No    Physically Abused: No    Sexually Abused: No    FAMILY HISTORY: Family History  Problem Relation Age of Onset   Breast cancer Mother    Hypertension Mother    Diabetes Father    Hypertension Father    Heart disease Father     ALLERGIES:  is allergic to doxycycline, hydrochlorothiazide, ketorolac , other, prednisone , and sulfa antibiotics.  MEDICATIONS:  Current Outpatient Medications  Medication Sig Dispense Refill   albuterol  (PROVENTIL ) (2.5 MG/3ML) 0.083% nebulizer solution      albuterol  (VENTOLIN  HFA) 108 (90 Base) MCG/ACT inhaler Inhale 2 puffs into the lungs.     amLODipine (NORVASC) 10 MG tablet Take 10 mg by mouth daily.     aspirin EC 81 MG tablet Take 81 mg by mouth.     atorvastatin (LIPITOR) 10 MG tablet Take 10 mg by mouth daily.     baclofen (LIORESAL) 10 MG tablet      Blood Glucose Monitoring Suppl (ACCU-CHEK GUIDE) w/Device KIT See admin instructions.     carvedilol (COREG) 25 MG tablet Take 12.5 mg by mouth 2 (two) times daily.     Cholecalciferol (VITAMIN D3) 50 MCG (2000 UT) capsule Take 2,000 Units by mouth daily.     clopidogrel (PLAVIX) 75 MG tablet Take 75 mg by mouth daily.     colchicine 0.6 MG tablet Take 0.6 mg by mouth daily.     Cyanocobalamin  (VITAMIN B12) 1000 MCG TBCR Take 1 tablet by mouth daily.     Dulaglutide (TRULICITY) 0.75 MG/0.5ML  SOPN Inject into the skin once a week.     DULoxetine (CYMBALTA) 60 MG capsule Take 60 mg by mouth daily.     famotidine (PEPCID) 20 MG tablet Take 20 mg by mouth.     gabapentin (NEURONTIN) 300 MG capsule Take  300 mg by mouth.     indomethacin (INDOCIN) 50 MG capsule Take 1 capsule by mouth 3 (three) times daily as needed.     Insulin  Pen Needle (BD PEN NEEDLE NANO 2ND GEN) 32G X 4 MM MISC USE 1 PEN NEEDLE DAILY     isosorbide mononitrate (IMDUR) 60 MG 24 hr tablet Take 60 mg by mouth.     labetalol (NORMODYNE) 200 MG tablet Take 200 mg by mouth daily.     LANTUS SOLOSTAR 100 UNIT/ML Solostar Pen SMARTSIG:20 Unit(s) SUB-Q Daily     levothyroxine (SYNTHROID) 200 MCG tablet Take 200 mcg by mouth every morning.     lisinopril (ZESTRIL) 10 MG tablet Take 10 mg by mouth daily.     LORazepam (ATIVAN) 1 MG tablet Take 1 mg by mouth every 8 (eight) hours.     MITIGARE 0.6 MG CAPS Take 1 capsule by mouth daily.     NOVOLOG FLEXPEN 100 UNIT/ML FlexPen SMARTSIG:10 Unit(s) SUB-Q 3 Times Daily     ondansetron  (ZOFRAN -ODT) 8 MG disintegrating tablet Take 1 tablet (8 mg total) by mouth every 8 (eight) hours. 20 tablet 1   pantoprazole  (PROTONIX ) 40 MG tablet Take 1 tablet (40 mg total) by mouth 2 (two) times daily. 60 tablet 3   Pro Comfort Lancets 31G MISC 2 (two) times daily.     sodium bicarbonate 650 MG tablet Take 650 mg by mouth daily.     TRUE METRIX BLOOD GLUCOSE TEST test strip 2 (two) times daily.     No current facility-administered medications for this visit.    REVIEW OF SYSTEMS:   Constitutional: Denies fevers, chills or abnormal night sweats Eyes: Denies blurriness of vision, double vision or watery eyes Ears, nose, mouth, throat, and face: Denies mucositis or sore throat Respiratory: Denies cough, dyspnea or wheezes Cardiovascular: Denies palpitation, chest discomfort or lower extremity swelling Gastrointestinal:  Denies nausea, heartburn or change in bowel habits Skin: Denies abnormal skin rashes Lymphatics: Denies new lymphadenopathy or easy bruising Neurological:Denies numbness, tingling or new weaknesses Behavioral/Psych: Mood is stable, no new changes  All other systems were reviewed  with the patient and are negative.  PHYSICAL EXAMINATION: ECOG PERFORMANCE STATUS: 2 - Symptomatic, <50% confined to bed  Vitals:   07/22/24 1336 07/22/24 1341  BP: (!) 129/96 (!) 128/95  Pulse: 88   Resp: 20   Temp: 98 F (36.7 C)   SpO2: 96%    Filed Weights   07/22/24 1336  Weight: 220 lb 14.4 oz (100.2 kg)    GENERAL:alert, no distress and comfortable.  She appears older than stated age and looks pale SKIN: skin color is pale, texture, turgor are normal, no rashes or significant lesions EYES: normal, conjunctiva are pale and non-injected, sclera clear OROPHARYNX:no exudate, no erythema and lips, buccal mucosa, and tongue normal  NECK: supple, thyroid  normal size, non-tender, without nodularity LYMPH:  no palpable lymphadenopathy in the cervical, axillary or inguinal LUNGS: clear to auscultation and percussion with normal breathing effort HEART: regular rate & rhythm and no murmurs and no lower extremity edema ABDOMEN:abdomen soft, non-tender and normal bowel sounds Musculoskeletal:no cyanosis of digits and no clubbing.  Noted signs of muscle wasting and sarcopenia PSYCH: alert &  oriented x 3 with fluent speech NEURO: no focal motor/sensory deficits

## 2024-07-22 NOTE — Assessment & Plan Note (Signed)
 The cause of her anemia is multifactorial but most likely due to anemia chronic kidney disease and poorly controlled diabetes I will order additional workup as well as test to rule out paraproteinemia I will see her next week If she qualifies, we will order erythropoietin  stimulating agent

## 2024-07-22 NOTE — Assessment & Plan Note (Signed)
 She has severe chronic kidney disease stage IV due to poor oral fluid hydration and diabetes I recommend the patient to increase oral fluid intake as tolerated

## 2024-07-22 NOTE — Assessment & Plan Note (Signed)
 She has signs of muscle wasting and sarcopenia We discussed importance of frequent small meals and higher protein intake

## 2024-07-22 NOTE — Assessment & Plan Note (Signed)
 She has poorly controlled diabetes with poor dietary choices We discussed importance of frequent small meals and higher protein intake

## 2024-07-22 NOTE — Assessment & Plan Note (Signed)
 Her MRI showed evidence of liver cirrhosis and splenomegaly, likely due to NASH

## 2024-07-23 LAB — ERYTHROPOIETIN: Erythropoietin: 18.4 m[IU]/mL (ref 2.6–18.5)

## 2024-07-23 LAB — KAPPA/LAMBDA LIGHT CHAINS
Kappa free light chain: 51.2 mg/L — ABNORMAL HIGH (ref 3.3–19.4)
Kappa, lambda light chain ratio: 1.38 (ref 0.26–1.65)
Lambda free light chains: 37.1 mg/L — ABNORMAL HIGH (ref 5.7–26.3)

## 2024-07-24 LAB — MULTIPLE MYELOMA PANEL, SERUM
Albumin SerPl Elph-Mcnc: 3.7 g/dL (ref 2.9–4.4)
Albumin/Glob SerPl: 1.2 (ref 0.7–1.7)
Alpha 1: 0.2 g/dL (ref 0.0–0.4)
Alpha2 Glob SerPl Elph-Mcnc: 0.9 g/dL (ref 0.4–1.0)
B-Globulin SerPl Elph-Mcnc: 1 g/dL (ref 0.7–1.3)
Gamma Glob SerPl Elph-Mcnc: 1.1 g/dL (ref 0.4–1.8)
Globulin, Total: 3.2 g/dL (ref 2.2–3.9)
IgA: 279 mg/dL (ref 87–352)
IgG (Immunoglobin G), Serum: 1136 mg/dL (ref 586–1602)
IgM (Immunoglobulin M), Srm: 103 mg/dL (ref 26–217)
Total Protein ELP: 6.9 g/dL (ref 6.0–8.5)

## 2024-07-29 ENCOUNTER — Ambulatory Visit: Admitting: Hematology and Oncology

## 2024-07-29 ENCOUNTER — Other Ambulatory Visit: Payer: Self-pay | Admitting: *Deleted

## 2024-07-29 ENCOUNTER — Inpatient Hospital Stay

## 2024-07-29 DIAGNOSIS — K7460 Unspecified cirrhosis of liver: Secondary | ICD-10-CM

## 2024-07-29 DIAGNOSIS — N184 Chronic kidney disease, stage 4 (severe): Secondary | ICD-10-CM

## 2024-07-31 ENCOUNTER — Other Ambulatory Visit

## 2024-07-31 ENCOUNTER — Ambulatory Visit: Admitting: Oncology

## 2024-08-08 DIAGNOSIS — M25561 Pain in right knee: Secondary | ICD-10-CM | POA: Diagnosis not present

## 2024-08-08 DIAGNOSIS — M79672 Pain in left foot: Secondary | ICD-10-CM | POA: Diagnosis not present

## 2024-08-08 DIAGNOSIS — M109 Gout, unspecified: Secondary | ICD-10-CM | POA: Diagnosis not present

## 2024-08-08 DIAGNOSIS — M79671 Pain in right foot: Secondary | ICD-10-CM | POA: Diagnosis not present

## 2024-08-09 DIAGNOSIS — M109 Gout, unspecified: Secondary | ICD-10-CM | POA: Diagnosis not present

## 2024-08-09 DIAGNOSIS — E1122 Type 2 diabetes mellitus with diabetic chronic kidney disease: Secondary | ICD-10-CM | POA: Diagnosis not present

## 2024-08-09 DIAGNOSIS — I2510 Atherosclerotic heart disease of native coronary artery without angina pectoris: Secondary | ICD-10-CM | POA: Diagnosis not present

## 2024-08-13 ENCOUNTER — Inpatient Hospital Stay: Admitting: Oncology

## 2024-08-13 ENCOUNTER — Inpatient Hospital Stay

## 2024-08-27 ENCOUNTER — Inpatient Hospital Stay

## 2024-08-27 ENCOUNTER — Inpatient Hospital Stay: Attending: Hematology and Oncology | Admitting: Oncology

## 2024-08-27 VITALS — BP 127/100 | HR 83 | Temp 98.1°F | Resp 16 | Wt 223.8 lb

## 2024-08-27 DIAGNOSIS — D509 Iron deficiency anemia, unspecified: Secondary | ICD-10-CM | POA: Insufficient documentation

## 2024-08-27 DIAGNOSIS — D631 Anemia in chronic kidney disease: Secondary | ICD-10-CM

## 2024-08-27 DIAGNOSIS — K7581 Nonalcoholic steatohepatitis (NASH): Secondary | ICD-10-CM

## 2024-08-27 DIAGNOSIS — N184 Chronic kidney disease, stage 4 (severe): Secondary | ICD-10-CM

## 2024-08-27 DIAGNOSIS — K7460 Unspecified cirrhosis of liver: Secondary | ICD-10-CM | POA: Diagnosis not present

## 2024-08-27 DIAGNOSIS — N189 Chronic kidney disease, unspecified: Secondary | ICD-10-CM

## 2024-08-27 LAB — COMPREHENSIVE METABOLIC PANEL WITH GFR
ALT: 12 U/L (ref 0–44)
AST: <10 U/L — ABNORMAL LOW (ref 15–41)
Albumin: 3.3 g/dL — ABNORMAL LOW (ref 3.5–5.0)
Alkaline Phosphatase: 114 U/L (ref 38–126)
Anion gap: 10 (ref 5–15)
BUN: 52 mg/dL — ABNORMAL HIGH (ref 6–20)
CO2: 18 mmol/L — ABNORMAL LOW (ref 22–32)
Calcium: 8.3 mg/dL — ABNORMAL LOW (ref 8.9–10.3)
Chloride: 108 mmol/L (ref 98–111)
Creatinine, Ser: 2.83 mg/dL — ABNORMAL HIGH (ref 0.44–1.00)
GFR, Estimated: 21 mL/min — ABNORMAL LOW (ref >60)
Glucose, Bld: 148 mg/dL — ABNORMAL HIGH (ref 70–99)
Potassium: 4.4 mmol/L (ref 3.5–5.1)
Sodium: 136 mmol/L (ref 135–145)
Total Bilirubin: 0.6 mg/dL (ref 0.0–1.2)
Total Protein: 6.8 g/dL (ref 6.5–8.1)

## 2024-08-27 NOTE — Assessment & Plan Note (Addendum)
 She denies any bright red blood per rectum, melena or hematochezia.  She has never had a colonoscopy. Labs showed low ferritin and iron saturations.  She does not take iron supplements. We discussed given significant fatigue, would recommend IV iron based on insurance approval. Return to clinic in 10 weeks to discuss labs and follow-up.

## 2024-08-27 NOTE — Progress Notes (Signed)
 [facility] OFFICE PROGRESS NOTE  Angela Jerel MATSU, MD  ASSESSMENT & PLAN:    Assessment & Plan Anemia of chronic renal failure, unspecified CKD stage Multifactorial including IDA and CKD.  Myeloma essentially unremarkable.  Kappa and lambda free light chain slightly elevated likely due to CKD.  Hemolysis.  No sign of chronic inflammation with normal LDH and sedimentation rate.  TSH and B12 WNL. Most recent labs showed normal hemoglobin 12.1 with low iron levels.  Creatinine 2.83 which appears to be baseline. Previously discussed potentially starting EPO injections should hemoglobin trend below 10 despite replacing iron.  For now, will replace iron and recheck labs in 8 weeks. Liver cirrhosis secondary to NASH (nonalcoholic steatohepatitis) (HCC) Mild thrombocytopenia likely secondary to underlying liver disease. Monitor.  Most recent platelet count was 20. Denies any bleeding. Iron deficiency anemia, unspecified iron deficiency anemia type She denies any bright red blood per rectum, melena or hematochezia.  She has never had a colonoscopy. Labs showed low ferritin and iron saturations.  She does not take iron supplements. We discussed given significant fatigue, would recommend IV iron based on insurance approval. Return to clinic in 10 weeks to discuss labs and follow-up.  Orders Placed This Encounter  Procedures   CBC with Differential    Standing Status:   Future    Expected Date:   11/05/2024    Expiration Date:   02/03/2025   Iron and TIBC ([facility] ONLY)    Standing Status:   Future    Expected Date:   11/05/2024    Expiration Date:   02/03/2025   Ferritin    Standing Status:   Future    Expected Date:   11/05/2024    Expiration Date:   02/03/2025    INTERVAL HISTORY: Patient returns for follow-up.  Reports she is still having significant fatigue.  Getting up to go to the bathroom is a job.  She tires easily.  She has lower extremity  pain at all times.  Reports she rarely feels like taking a shower.  Reports appetite 75% has shortness of breath and chest pain which has significantly worsened over the past 6 months.  Reports her sister's and was found to be B12 deficient.  She is currently taking B12 supplements.  We reviewed differential, iron levels, TSH, B12, reticulocytes, myeloma panel, CMP, erythropoietin , kappa lambda light chains.  SUMMARY OF HEMATOLOGIC HISTORY: Oncology History   No history exists.     CBC    Component Value Date/Time   WBC 14.8 (H) 07/22/2024 1427   RBC 4.50 07/22/2024 1427   RBC 4.43 07/22/2024 1421   HGB 12.1 07/22/2024 1427   HGB 14.2 10/24/2022 1337   HCT 38.3 07/22/2024 1427   HCT 41.9 10/24/2022 1337   PLT 120 (L) 07/22/2024 1427   PLT 163 10/24/2022 1337   MCV 85.1 07/22/2024 1427   MCV 85 10/24/2022 1337   MCH 26.9 07/22/2024 1427   MCHC 31.6 07/22/2024 1427   RDW 15.5 07/22/2024 1427   RDW 14.5 10/24/2022 1337   LYMPHSABS 6.6 (H) 07/22/2024 1427   MONOABS 0.8 07/22/2024 1427   EOSABS 0.1 07/22/2024 1427   BASOSABS 0.0 07/22/2024 1427       Latest Ref Rng & Units 08/27/2024   12:22 PM 07/22/2024    2:20 PM 10/24/2022    1:37 PM  CMP  Glucose 70 - 99 mg/dL 851  820  878   BUN 6 - 20 mg/dL 52  58  32   Creatinine 0.44 - 1.00 mg/dL 7.16  7.02  6.72   Sodium 135 - 145 mmol/L 136  138  139   Potassium 3.5 - 5.1 mmol/L 4.4  4.2  4.1   Chloride 98 - 111 mmol/L 108  108  101   CO2 22 - 32 mmol/L 18  18  16    Calcium 8.9 - 10.3 mg/dL 8.3  8.4  89.8   Total Protein 6.5 - 8.1 g/dL 6.8  7.3  8.2   Total Bilirubin 0.0 - 1.2 mg/dL 0.6  0.7  0.8   Alkaline Phos 38 - 126 U/L 114  141  202   AST 15 - 41 U/L 10  10  18    ALT 0 - 44 U/L 12  14  11       Lab Results  Component Value Date   FERRITIN 20 07/22/2024   VITAMINB12 2,633 (H) 07/22/2024    Vitals:   08/27/24 1306 08/27/24 1311  BP: (!) 138/100 (!) 127/100  Pulse: 83   Resp: 16   Temp: 98.1 F (36.7 C)    SpO2: 100%     Review of System:  Review of Systems  Constitutional:  Positive for malaise/fatigue.  Respiratory:  Positive for shortness of breath.   Cardiovascular:  Positive for chest pain and palpitations.  Gastrointestinal:  Positive for nausea and vomiting.  Musculoskeletal:  Positive for joint pain.  Neurological:  Positive for dizziness, sensory change, weakness and headaches.  Psychiatric/Behavioral:  Positive for depression.     Physical Exam: Physical Exam Constitutional:      Appearance: Normal appearance. She is obese.  HENT:     Head: Normocephalic and atraumatic.  Eyes:     Pupils: Pupils are equal, round, and reactive to light.  Cardiovascular:     Rate and Rhythm: Normal rate and regular rhythm.     Heart sounds: Normal heart sounds. No murmur heard. Pulmonary:     Effort: Pulmonary effort is normal.     Breath sounds: Normal breath sounds. No wheezing.  Abdominal:     General: Bowel sounds are normal. There is no distension.     Palpations: Abdomen is soft.     Tenderness: There is no abdominal tenderness.  Musculoskeletal:        General: Normal range of motion.     Cervical back: Normal range of motion.  Skin:    General: Skin is warm and dry.     Findings: No rash.  Neurological:     Mental Status: She is alert and oriented to person, place, and time.     Gait: Gait is intact.  Psychiatric:        Mood and Affect: Mood and affect normal.        Cognition and Memory: Memory normal.        Judgment: Judgment normal.      I spent 25 minutes dedicated to the care of this patient (face-to-face and non-face-to-face) on the date of the encounter to include what is described in the assessment and plan.,  Delon Hope, NP 08/27/2024 3:06 PM

## 2024-08-27 NOTE — Assessment & Plan Note (Addendum)
 Multifactorial including IDA and CKD.  Myeloma essentially unremarkable.  Kappa and lambda free light chain slightly elevated likely due to CKD.  Hemolysis.  No sign of chronic inflammation with normal LDH and sedimentation rate.  TSH and B12 WNL. Most recent labs showed normal hemoglobin 12.1 with low iron levels.  Creatinine 2.83 which appears to be baseline. Previously discussed potentially starting EPO injections should hemoglobin trend below 10 despite replacing iron.  For now, will replace iron and recheck labs in 8 weeks.

## 2024-08-27 NOTE — Assessment & Plan Note (Signed)
 Mild thrombocytopenia likely secondary to underlying liver disease. Monitor.  Most recent platelet count was 20. Denies any bleeding.

## 2024-08-27 NOTE — Assessment & Plan Note (Signed)
 Her MRI showed evidence of liver cirrhosis and splenomegaly, likely due to NASH

## 2024-09-02 ENCOUNTER — Inpatient Hospital Stay

## 2024-09-02 VITALS — BP 112/87 | HR 71 | Temp 98.6°F | Resp 18

## 2024-09-02 DIAGNOSIS — D509 Iron deficiency anemia, unspecified: Secondary | ICD-10-CM | POA: Diagnosis not present

## 2024-09-02 MED ORDER — SODIUM CHLORIDE 0.9 % IV SOLN: INTRAVENOUS | Status: DC

## 2024-09-02 MED ORDER — ACETAMINOPHEN 325 MG PO TABS
650.0000 mg | ORAL_TABLET | Freq: Once | ORAL | Status: AC
  Administered 2024-09-02: 650 mg via ORAL
  Filled 2024-09-02: qty 2

## 2024-09-02 MED ORDER — FAMOTIDINE IN NACL 20-0.9 MG/50ML-% IV SOLN
20.0000 mg | Freq: Once | INTRAVENOUS | Status: AC
  Administered 2024-09-02: 20 mg via INTRAVENOUS
  Filled 2024-09-02: qty 50

## 2024-09-02 MED ORDER — SODIUM CHLORIDE 0.9 % IV SOLN
510.0000 mg | Freq: Once | INTRAVENOUS | Status: AC
  Administered 2024-09-02: 510 mg via INTRAVENOUS
  Filled 2024-09-02: qty 510

## 2024-09-02 MED ORDER — CETIRIZINE HCL 10 MG/ML IV SOLN
10.0000 mg | Freq: Once | INTRAVENOUS | Status: AC
  Administered 2024-09-02: 10 mg via INTRAVENOUS
  Filled 2024-09-02: qty 1

## 2024-09-02 NOTE — Patient Instructions (Signed)
 [facility] [place] - A DEPT OF [facility]  Discharge Instructions: Thank you for choosing [facility] to provide your oncology and hematology care.  If you have a lab appointment with the Cancer Center - please note that after April 8th, 2024, all labs will be drawn in the cancer center.  You do not have to check in or register with the main entrance as you have in the past but will complete your check-in in the cancer center.  Wear comfortable clothing and clothing appropriate for easy access to any Portacath or PICC line.   We strive to give you quality time with your provider. You may need to reschedule your appointment if you arrive late (15 or more minutes).  Arriving late affects you and other patients whose appointments are after yours.  Also, if you miss three or more appointments without notifying the office, you may be dismissed from the clinic at the provider's discretion.      For prescription refill requests, have your pharmacy contact our office and allow 72 hours for refills to be completed.    Today you received the following chemotherapy and/or immunotherapy agents Feraheme. Ferumoxytol Injection What is this medication? FERUMOXYTOL (FER ue MOX i tol) treats low levels of iron in your body (iron deficiency anemia). Iron is a mineral that plays an important role in making red blood cells, which carry oxygen from your lungs to the rest of your body. This medicine may be used for other purposes; ask your health care provider or pharmacist if you have questions. COMMON BRAND NAME(S): Feraheme What should I tell my care team before I take this medication? They need to know if you have any of these conditions: Anemia not caused by low iron levels High levels of iron in the blood Magnetic resonance imaging (MRI) test scheduled An unusual or allergic reaction to iron, other medications, foods, dyes, or preservatives Pregnant or trying to get  pregnant Breastfeeding How should I use this medication? This medication is injected into a vein. It is given by your care team in a hospital or clinic setting. Talk to your care team the use of this medication in children. Special care may be needed. Overdosage: If you think you have taken too much of this medicine contact a poison control center or emergency room at once. NOTE: This medicine is only for you. Do not share this medicine with others. What if I miss a dose? It is important not to miss your dose. Call your care team if you are unable to keep an appointment. What may interact with this medication? Other iron products This list may not describe all possible interactions. Give your health care provider a list of all the medicines, herbs, non-prescription drugs, or dietary supplements you use. Also tell them if you smoke, drink alcohol, or use illegal drugs. Some items may interact with your medicine. What should I watch for while using this medication? Visit your care team for regular checks on your progress. Tell your care team if your symptoms do not start to get better or if they get worse. You may need blood work done while you are taking this medication. You may need to eat more foods that contain iron. Talk to your care team. Foods that contain iron include whole grains or cereals, dried fruits, beans, peas, leafy green vegetables, and organ meats (liver, kidney). What side effects may I notice from receiving this medication? Side effects that  you should report to your care team as soon as possible: Allergic reactions--skin rash, itching, hives, swelling of the face, lips, tongue, or throat Low blood pressure--dizziness, feeling faint or lightheaded, blurry vision Shortness of breath Side effects that usually do not require medical attention (report to your care team if they continue or are bothersome): Flushing Headache Joint pain Muscle pain Nausea Pain, redness, or  irritation at injection site This list may not describe all possible side effects. Call your doctor for medical advice about side effects. You may report side effects to FDA at 1-800-FDA-1088. Where should I keep my medication? This medication is given in a hospital or clinic. It will not be stored at home. NOTE: This sheet is a summary. It may not cover all possible information. If you have questions about this medicine, talk to your doctor, pharmacist, or health care provider.  2024 Elsevier/Gold Standard (2023-07-19 00:00:00)      To help prevent nausea and vomiting after your treatment, we encourage you to take your nausea medication as directed.  BELOW ARE SYMPTOMS THAT SHOULD BE REPORTED IMMEDIATELY: *FEVER GREATER THAN 100.4 F (38 C) OR HIGHER *CHILLS OR SWEATING *NAUSEA AND VOMITING THAT IS NOT CONTROLLED WITH YOUR NAUSEA MEDICATION *UNUSUAL SHORTNESS OF BREATH *UNUSUAL BRUISING OR BLEEDING *URINARY PROBLEMS (pain or burning when urinating, or frequent urination) *BOWEL PROBLEMS (unusual diarrhea, constipation, pain near the anus) TENDERNESS IN MOUTH AND THROAT WITH OR WITHOUT PRESENCE OF ULCERS (sore throat, sores in mouth, or a toothache) UNUSUAL RASH, SWELLING OR PAIN  UNUSUAL VAGINAL DISCHARGE OR ITCHING   Items with * indicate a potential emergency and should be followed up as soon as possible or go to the Emergency Department if any problems should occur.  Please show the CHEMOTHERAPY ALERT CARD or IMMUNOTHERAPY ALERT CARD at check-in to the Emergency Department and triage nurse.  Should you have questions after your visit or need to cancel or reschedule your appointment, please contact [facility] [place] - A DEPT OF Eligh[facility]ideg[facility] [phone]  and follow the prompts.  Office hours are 8:00 a.m. to 4:30 p.m. Monday - Friday. Please note that voicemails left after 4:00 p.m. may not be returned until the following business day.  We are closed weekends  and major holidays. You have access to a nurse at all times for urgent questions. Please call the main number to the clinic [phone] and follow the prompts.  For any non-urgent questions, you may also contact your provider using MyChart. We now offer e-Visits for anyone 67 and older to request care online for non-urgent symptoms. For details visit [email or web address].   Also download the MyChart app! Go to the app store, search "MyChart", open the app, select [place], and log in with your MyChart username and password.

## 2024-09-02 NOTE — Progress Notes (Signed)
 Patient presents today for first iron infusion of Feraheme.  Patient is in satisfactory condition with no new complaints voiced.  Vital signs are stable.  We will proceed with infusion per provider orders.    Patient tolerated treatment well with no complaints voiced.  Patient left via wheelchair in stable condition.  Vital signs stable at discharge.  Follow up as scheduled.

## 2024-09-05 DIAGNOSIS — E1122 Type 2 diabetes mellitus with diabetic chronic kidney disease: Secondary | ICD-10-CM | POA: Diagnosis not present

## 2024-09-05 DIAGNOSIS — F17200 Nicotine dependence, unspecified, uncomplicated: Secondary | ICD-10-CM | POA: Diagnosis not present

## 2024-09-05 DIAGNOSIS — I2510 Atherosclerotic heart disease of native coronary artery without angina pectoris: Secondary | ICD-10-CM | POA: Diagnosis not present

## 2024-09-05 DIAGNOSIS — I252 Old myocardial infarction: Secondary | ICD-10-CM | POA: Diagnosis not present

## 2024-09-05 DIAGNOSIS — N184 Chronic kidney disease, stage 4 (severe): Secondary | ICD-10-CM | POA: Diagnosis not present

## 2024-09-05 DIAGNOSIS — Z23 Encounter for immunization: Secondary | ICD-10-CM | POA: Diagnosis not present

## 2024-09-05 DIAGNOSIS — M1A39X1 Chronic gout due to renal impairment, multiple sites, with tophus (tophi): Secondary | ICD-10-CM | POA: Diagnosis not present

## 2024-09-05 DIAGNOSIS — M87052 Idiopathic aseptic necrosis of left femur: Secondary | ICD-10-CM | POA: Diagnosis not present

## 2024-09-05 DIAGNOSIS — Z79899 Other long term (current) drug therapy: Secondary | ICD-10-CM | POA: Diagnosis not present

## 2024-09-09 ENCOUNTER — Inpatient Hospital Stay

## 2024-09-09 VITALS — BP 136/81 | HR 76 | Temp 96.9°F | Resp 16

## 2024-09-09 DIAGNOSIS — D509 Iron deficiency anemia, unspecified: Secondary | ICD-10-CM

## 2024-09-09 MED ORDER — ACETAMINOPHEN 325 MG PO TABS
650.0000 mg | ORAL_TABLET | Freq: Once | ORAL | Status: AC
  Administered 2024-09-09: 650 mg via ORAL
  Filled 2024-09-09: qty 2

## 2024-09-09 MED ORDER — FAMOTIDINE IN NACL 20-0.9 MG/50ML-% IV SOLN
20.0000 mg | Freq: Once | INTRAVENOUS | Status: AC
  Administered 2024-09-09: 20 mg via INTRAVENOUS
  Filled 2024-09-09: qty 50

## 2024-09-09 MED ORDER — SODIUM CHLORIDE 0.9 % IV SOLN
510.0000 mg | Freq: Once | INTRAVENOUS | Status: AC
  Administered 2024-09-09: 510 mg via INTRAVENOUS
  Filled 2024-09-09: qty 510

## 2024-09-09 MED ORDER — SODIUM CHLORIDE 0.9 % IV SOLN: INTRAVENOUS | Status: DC

## 2024-09-09 MED ORDER — CETIRIZINE HCL 10 MG/ML IV SOLN
10.0000 mg | Freq: Once | INTRAVENOUS | Status: AC
  Administered 2024-09-09: 10 mg via INTRAVENOUS
  Filled 2024-09-09: qty 1

## 2024-09-09 NOTE — Patient Instructions (Signed)

## 2024-09-09 NOTE — Progress Notes (Signed)
Feraheme iron infusion given per orders. Patient tolerated it well without problems. Vitals stable and discharged home from clinic via wheelchair. Follow up as scheduled.

## 2024-09-10 DIAGNOSIS — E1122 Type 2 diabetes mellitus with diabetic chronic kidney disease: Secondary | ICD-10-CM | POA: Diagnosis not present

## 2024-09-10 DIAGNOSIS — M109 Gout, unspecified: Secondary | ICD-10-CM | POA: Diagnosis not present

## 2024-09-10 DIAGNOSIS — I2510 Atherosclerotic heart disease of native coronary artery without angina pectoris: Secondary | ICD-10-CM | POA: Diagnosis not present

## 2024-09-27 DIAGNOSIS — K7460 Unspecified cirrhosis of liver: Secondary | ICD-10-CM | POA: Diagnosis not present

## 2024-09-27 DIAGNOSIS — Z794 Long term (current) use of insulin: Secondary | ICD-10-CM | POA: Diagnosis not present

## 2024-09-27 DIAGNOSIS — K219 Gastro-esophageal reflux disease without esophagitis: Secondary | ICD-10-CM | POA: Diagnosis not present

## 2024-09-27 DIAGNOSIS — E1122 Type 2 diabetes mellitus with diabetic chronic kidney disease: Secondary | ICD-10-CM | POA: Diagnosis not present

## 2024-09-27 DIAGNOSIS — N281 Cyst of kidney, acquired: Secondary | ICD-10-CM | POA: Diagnosis not present

## 2024-09-27 DIAGNOSIS — N2889 Other specified disorders of kidney and ureter: Secondary | ICD-10-CM | POA: Diagnosis not present

## 2024-09-27 DIAGNOSIS — N200 Calculus of kidney: Secondary | ICD-10-CM | POA: Diagnosis not present

## 2024-09-27 DIAGNOSIS — Z20822 Contact with and (suspected) exposure to covid-19: Secondary | ICD-10-CM | POA: Diagnosis not present

## 2024-09-27 DIAGNOSIS — N184 Chronic kidney disease, stage 4 (severe): Secondary | ICD-10-CM | POA: Diagnosis not present

## 2024-09-27 DIAGNOSIS — N179 Acute kidney failure, unspecified: Secondary | ICD-10-CM | POA: Diagnosis not present

## 2024-09-27 DIAGNOSIS — I129 Hypertensive chronic kidney disease with stage 1 through stage 4 chronic kidney disease, or unspecified chronic kidney disease: Secondary | ICD-10-CM | POA: Diagnosis not present

## 2024-09-27 DIAGNOSIS — E7800 Pure hypercholesterolemia, unspecified: Secondary | ICD-10-CM | POA: Diagnosis not present

## 2024-09-27 DIAGNOSIS — E872 Acidosis, unspecified: Secondary | ICD-10-CM | POA: Diagnosis not present

## 2024-09-28 DIAGNOSIS — E1165 Type 2 diabetes mellitus with hyperglycemia: Secondary | ICD-10-CM | POA: Diagnosis not present

## 2024-09-28 DIAGNOSIS — D631 Anemia in chronic kidney disease: Secondary | ICD-10-CM | POA: Diagnosis not present

## 2024-09-28 DIAGNOSIS — I10 Essential (primary) hypertension: Secondary | ICD-10-CM | POA: Diagnosis not present

## 2024-09-28 DIAGNOSIS — F411 Generalized anxiety disorder: Secondary | ICD-10-CM | POA: Diagnosis not present

## 2024-09-28 DIAGNOSIS — E1122 Type 2 diabetes mellitus with diabetic chronic kidney disease: Secondary | ICD-10-CM | POA: Diagnosis not present

## 2024-09-28 DIAGNOSIS — Z20822 Contact with and (suspected) exposure to covid-19: Secondary | ICD-10-CM | POA: Diagnosis not present

## 2024-09-28 DIAGNOSIS — I129 Hypertensive chronic kidney disease with stage 1 through stage 4 chronic kidney disease, or unspecified chronic kidney disease: Secondary | ICD-10-CM | POA: Diagnosis not present

## 2024-09-28 DIAGNOSIS — E872 Acidosis, unspecified: Secondary | ICD-10-CM | POA: Diagnosis not present

## 2024-09-28 DIAGNOSIS — M87851 Other osteonecrosis, right femur: Secondary | ICD-10-CM | POA: Diagnosis not present

## 2024-09-28 DIAGNOSIS — K7460 Unspecified cirrhosis of liver: Secondary | ICD-10-CM | POA: Diagnosis not present

## 2024-09-28 DIAGNOSIS — E7800 Pure hypercholesterolemia, unspecified: Secondary | ICD-10-CM | POA: Diagnosis not present

## 2024-09-28 DIAGNOSIS — E669 Obesity, unspecified: Secondary | ICD-10-CM | POA: Diagnosis not present

## 2024-09-28 DIAGNOSIS — K219 Gastro-esophageal reflux disease without esophagitis: Secondary | ICD-10-CM | POA: Diagnosis not present

## 2024-09-28 DIAGNOSIS — E119 Type 2 diabetes mellitus without complications: Secondary | ICD-10-CM | POA: Diagnosis not present

## 2024-09-28 DIAGNOSIS — Z79899 Other long term (current) drug therapy: Secondary | ICD-10-CM | POA: Diagnosis not present

## 2024-09-28 DIAGNOSIS — N179 Acute kidney failure, unspecified: Secondary | ICD-10-CM | POA: Diagnosis not present

## 2024-09-28 DIAGNOSIS — Z9189 Other specified personal risk factors, not elsewhere classified: Secondary | ICD-10-CM | POA: Diagnosis not present

## 2024-09-28 DIAGNOSIS — N184 Chronic kidney disease, stage 4 (severe): Secondary | ICD-10-CM | POA: Diagnosis not present

## 2024-09-28 DIAGNOSIS — E039 Hypothyroidism, unspecified: Secondary | ICD-10-CM | POA: Diagnosis not present

## 2024-09-28 DIAGNOSIS — M109 Gout, unspecified: Secondary | ICD-10-CM | POA: Diagnosis not present

## 2024-09-28 DIAGNOSIS — Z794 Long term (current) use of insulin: Secondary | ICD-10-CM | POA: Diagnosis not present

## 2024-09-28 DIAGNOSIS — E785 Hyperlipidemia, unspecified: Secondary | ICD-10-CM | POA: Diagnosis not present

## 2024-09-28 DIAGNOSIS — M87852 Other osteonecrosis, left femur: Secondary | ICD-10-CM | POA: Diagnosis not present

## 2024-09-30 NOTE — Discharge Summary (Signed)
 [facility] [employer] [facility]  Discharge Summary  PCP: No primary care provider on file. Discharge Details   Admit date:         09/28/2024 Discharge date and time:       09/30/2024 Hospital LOS:    3  days  Active Hospital Problems   Diagnosis Date Noted POA  . Hypothyroid 09/28/2024 Yes  . HLD (hyperlipidemia) 09/28/2024 Yes  . Generalized anxiety disorder 09/28/2024 Unknown  . Chronic prescription benzodiazepine use 09/28/2024 Not Applicable  . Insulin  dependent type 2 diabetes mellitus (*) 06/24/2023 Not Applicable  . Anemia, chronic renal failure, stage 4 (severe) (*) 06/24/2023 Yes  . CKD (chronic kidney disease) stage 4, GFR 15-29 ml/min (*) 11/02/2021 Yes  . GERD (gastroesophageal reflux disease) 10/30/2021 Yes  . Obesity 10/30/2021 Yes  . Hyperglycemia 10/30/2021 Yes  . Primary hypertension 10/30/2021 Yes    Resolved Hospital Problems   Diagnosis Date Noted Date Resolved POA  . *AKI (acute kidney injury) 09/28/2024 09/30/2024 Yes  . Metabolic acidosis 10/30/2021 09/30/2024 Yes      Current Discharge Medication List     PAUSE taking these medications as directed      Details  lisinopril 10 mg tablet Wait to take this until your doctor or other care provider tells you to start again. Commonly known as: PRINIVIL,ZESTRIL  Take one tablet (10 mg dose) by mouth daily. Pause reason: Other (Comment) Pause comment: AKI       START taking these medications      Details  aspirin EC tablet Commonly known as: ECOTRIN LOW DOSE Start taking on: October 01, 2024  Take one tablet (81 mg dose) by mouth daily.   cyclobenzaprine 5 mg tablet Commonly known as: FLEXERIL  Take one tablet (5 mg) by mouth 3 times a day as needed for Muscle spasms for up to 3 days. Quantity: 9 tablet       CONTINUE these medications which have CHANGED      Details  acetaminophen  500 mg tablet Commonly known as: TYLENOL  What changed:  how  much to take when to take this  Take two tablets (1,000 mg dose) by mouth 2 (two) times a day as needed for Pain.   carvedilol 12.5 mg tablet Commonly known as: COREG What changed: Another medication with the same name was removed. Continue taking this medication, and follow the directions you see here.  Take one tablet (12.5 mg dose) by mouth 2 (two) times daily. Quantity: 60 tablet   * isosorbide mononitrate 30 mg 24 hr tablet Commonly known as: IMDUR What changed: Another medication with the same name was removed. Continue taking this medication, and follow the directions you see here.  Take one tablet (30 mg dose) by mouth daily as needed (MAY take additional TABLET FOR CHEST pain). MAY take additional TABLET FOR CHEST pain   * isosorbide mononitrate 30 mg 24 hr tablet Commonly known as: IMDUR What changed: Another medication with the same name was removed. Continue taking this medication, and follow the directions you see here.  Take two tablets (60 mg dose) by mouth daily.   * predniSONE  5 mg tablet Commonly known as: DELTASONE  What changed: Another medication with the same name was changed. Make sure you understand how and when to take each.  Take one tablet (5 mg dose) by mouth daily.   * predniSONE  20 mg tablet Commonly known as: DELTASONE  Start taking on: September 30, 2024 What changed: See the new  instructions.  Take 2 tablets daily for 2 days, THEN 1 & 1/2 tablets daily for 2 days, THEN 1 tablet daily for 2 days, THEN 1/2 tablet  daily for 2 days. Quantity: 10 tablet      * * This list has 4 medication(s) that are the same as other medications prescribed for you. Read the directions carefully, and ask your doctor or other care provider to review them with you.          CONTINUE these medications which have NOT CHANGED      Details  atorvastatin 80 mg tablet Commonly known as: LIPITOR  Take one tablet (80 mg dose) by mouth at bedtime. Quantity: 30 tablet    canakinumab 150 mg/mL Soln injection Commonly known as: ILARIS  Inject 150 mg into the skin every 3 (three) months.   cetirizine  10 mg tablet Commonly known as: ZYRTEC   Take one tablet (10 mg dose) by mouth as needed for Allergies (DAILY PRN).   clopidogrel bisulfate 75 mg tablet Commonly known as: PLAVIX  Take one tablet (75 mg dose) by mouth daily.   DULoxetine HCl 60 mg capsule Commonly known as: CYMBALTA  Take one capsule (60 mg dose) by mouth daily.   febuxostat 40 mg tablet Commonly known as: ULORIC  Take one half tablet (20 mg dose) by mouth daily.   gabapentin 300 mg capsule Commonly known as: NEURONTIN  Take one capsule (300 mg dose) by mouth 3 (three) times a day.   LANTUS SOLOSTAR 100 UNIT/ML injection  Inject twenty Units into the skin at bedtime. Indication: Type 2 Diabetes, please check BG prior to admiistration of Lantus Insulin    levothyroxine 200 MCG tablet Commonly known as: SYNTHROID,LEVOTHROID,LEVOXYL  Take one tablet (200 mcg dose) by mouth daily at 6 (six) am. Quantity: 30 tablet   LORazepam 1 mg tablet Commonly known as: ATIVAN  Take one tablet (1 mg dose) by mouth daily.   NOVOLOG FLEXPEN 100 UNIT/ML injection Generic drug: insulin  aspart  Inject ten Units into the skin 15 (fifteen) minutes before meals.   pantoprazole  sodium 40 mg tablet Commonly known as: PROTONIX   Take one tablet (40 mg dose) by mouth 2 (two) times daily. Indication: REFRACTORY GERD Quantity: 30 tablet   sodium bicarbonate 650 mg tablet  Take one tablet (650 mg dose) by mouth daily.   vitamin B-12 1000 mcg tablet Commonly known as: CYANOCOBALAMIN   Take one tablet (1,000 mcg dose) by mouth daily.      * You might also be taking other medications not listed above. If you have questions about any of your other medications, talk to the person who prescribed them or your Primary Care Provider.          STOP taking these medications    linaclotide 145 mcg  capsule Commonly known as: LINZESS   medroxyPROGESTERone acetate 10 mg tablet Commonly known as: PROVERA   MITIGARE 0.6 MG Caps capsule Generic drug: colchicine   varenicline 1 mg tablet Commonly known as: [facility] Course   Indication for Admission/chief complaint: AKI   Hospital Course:       38 yo female with history of CAD, HTN, hypothyroidism, DM, neuropathy, chronic pain, fibromyalgia, anxiety, CKD IV and gout since she was a teenager who presented with AKI and gout flare.  She is followed by Rheumatology for gout that involves several joints and has had ongoing issues with this.  She reports that she was started on  Ilaris on 10/16.  Her pain did not improve and potentially worsened.  She became dehydrated and presented to the OSH due to continued severe pain and debility.  She had not started taking increased dose of prednisone  or NSAIDs reportedly.   She was transferred to [facility] due to significant AKI, Cr increased from baseline ~2.7 to 7.1 along with hyperkalemia, K of 6.8.  She was given IVF and temporizing measures for hyperkalemia prior to transfer.  Here was she was treated with IV bicarb, lokelma, prednisone  40mg  and oxycodone.  She improved significantly from a clinical standpoint.  Her pain was significantly improved and she was able to ambulate.  Her UOP improved significantly and her Cr improved as well.  She is now stable to discharge home to complete her prednisone  taper and follow up with Nephrology and Rheumatology as an outpt.  Further details by problem below.       AKI on CKD IV with hyperkalemia and metabolic acidosis - Nephrology following - Cr much improved today - down to 4 - discussed with Nephrology and she will follow up with home nephrologist as an outpt - K improved  - hold lisinopril on discharge   Severe pain, gout flare, chronic pain syndrome - continue prednisone  40mg  daily - taper ordered - takes 5mg  daily chronically - follow up  outpt with Rheumatology - decreased cymbalta and gabapentin but OK to resume home dose on discharge - no NSAIDs   CAD, HTN - continue asa, statin and plavix - OK to restart coreg and imdur - holding lisinopril on discharge   ETOH and NASH cirrhosis - noted history - heavy drinker but quit 10 years ago - decrease and discussed safe Tylenol  dosing   DM - A1c 6.9 in June - discussed hyperglycemia with steroids - OK to return to home regimen at discharge     Recommendations to physicians/followup needed:  - follow up with PCP, Rheumatology and Nephrology    Physical Exam: Vitals:   09/30/24 0820  BP: 134/89  Pulse: 75  Resp:   Temp:   SpO2:    Constitutional - sitting up in bed, reasonably comfortable Eyes - pupils equal round and reactive to light and accomodation Nose - no gross deformity or drainage Mouth - no oral lesions noted       CV - (+)S1S2, no murmurs, or peripheral edema, no JVD    Resp - CTA bilaterally, no wheezing or crackles; no clubbing, cyanosis  GI - (+)BS, soft, non-tender, non-distended MSK- tophi and swelling throughout Skin - no rashes or wounds Neuro - alert, aware, oriented to person/place/time  Psych - normal affect and mood     Labs on Discharge:  Recent Labs    Units 09/30/24 0202 09/29/24 0153 09/28/24 0850  WBC thou/mcL 6.1 8.0 6.2  HGB gm/dL 8.1* 8.6* 89.3*  HCT % 24.8* 26.6* 33.4*  PLT thou/mcL 69* 115* 121*   Recent Labs    Units 09/30/24 0202 09/29/24 0153 09/28/24 2217 09/28/24 1436 09/28/24 0850  NA mmol/L 135* 137 138 134* 136  K mmol/L 4.5 5.1 5.4 6.8* 6.8*  CL mmol/L 99 106 107 107 109*  CO2 mmol/L 24 16* 15* 13* 15*  BUN mg/dL 74* 76* 77* 74* 75*  CREATININE mg/dL 5.99* 5.18* 5.28* 5.19* 5.20*  CALCIUM mg/dL 8.4* 8.2* 8.1* 8.2* 8.6*  PHOS mg/dL  --  4.9*  --   --  5.6*   Recent Labs    Units 09/28/24 0850  BILITOT mg/dL 0.4  AST U/L 9  ALT U/L 10  ALKPHOS U/L 143  ALBUMIN gm/dL 3.4*   Recent  Labs    Units 09/28/24 1436  HGBA1C % 7.6*   No results for input(s): LABPROT, INR, PTT in the last 168 hours. No results for input(s): CHOL, LDL, HDL, TRIG in the last 168 hours. Recent Labs    Units 09/28/24 0850  CK U/L [facility] Care   Discharge Procedure Orders  Ambulatory referral to Sleep Studies  Standing Status: Future  Referral Priority: Routine Referral Type: Consultation  Referral Reason: Evaluate and Return  Number of Visits Requested: 1 Expiration Date: 03/26/25     Potential for Rehab:        Good Code Status:   Full Code Disposition: Home Consults: Nephrology  Followup appointments: No future appointments.   Time spent in discharge process:  total time spent 35 minutes   Electronically signed: Norman KATHEE Servant, MD 09/30/2024 / 12:39 PM   *Some images could not be shown.

## 2024-10-01 ENCOUNTER — Telehealth: Payer: Self-pay

## 2024-10-01 NOTE — Transitions of Care (Post Inpatient/ED Visit) (Signed)
   10/01/2024  Name: Angela Baker MRN: [id number] DOB: 12-26-1985  Today's TOC FU Call Status: Today's TOC FU Call Status:: Unsuccessful Call (1st Attempt) Unsuccessful Call (1st Attempt) Date: 10/01/24  Attempted to reach the patient regarding the most recent Inpatient/ED visit.  Follow Up Plan: Additional outreach attempts will be made to reach the patient to complete the Transitions of Care (Post Inpatient/ED visit) call.   Devanie Galanti J. Homer Pfeifer RN, MSN [facility], [facility] RN Care Manager Direct Dial: [phone]  Fax: [phone] Website: [email or web address]

## 2024-10-03 ENCOUNTER — Telehealth: Payer: Self-pay

## 2024-10-03 NOTE — Transitions of Care (Post Inpatient/ED Visit) (Signed)
   10/03/2024  Name: Angela Baker MRN: [id number] DOB: 05-19-1986  Today's TOC FU Call Status: Today's TOC FU Call Status:: Unsuccessful Call (2nd Attempt) Unsuccessful Call (2nd Attempt) Date: 10/03/24 (no VM set up)  Attempted to reach the patient regarding the most recent Inpatient/ED visit.  Follow Up Plan: Additional outreach attempts will be made to reach the patient to complete the Transitions of Care (Post Inpatient/ED visit) call.   Alan Ee, RN, BSN, CEN [employer]- Transition of Care Team.  [facility] [phone]

## 2024-10-04 ENCOUNTER — Telehealth: Payer: Self-pay

## 2024-10-04 NOTE — Transitions of Care (Post Inpatient/ED Visit) (Signed)
   10/04/2024  Name: Angela Baker MRN: [id number] DOB: 04/04/1986  Today's TOC FU Call Status: Today's TOC FU Call Status:: Unsuccessful Call (3rd Attempt) Unsuccessful Call (3rd Attempt) Date: 10/04/24  Attempted to reach the patient regarding the most recent Inpatient/ED visit.  Follow Up Plan: No further outreach attempts will be made at this time. We have been unable to contact the patient.  Alan Ee, RN, BSN, CEN [employer]- Transition of Care Team.  [facility] [phone]

## 2024-10-11 DIAGNOSIS — M109 Gout, unspecified: Secondary | ICD-10-CM | POA: Diagnosis not present

## 2024-10-11 DIAGNOSIS — E1122 Type 2 diabetes mellitus with diabetic chronic kidney disease: Secondary | ICD-10-CM | POA: Diagnosis not present

## 2024-10-11 DIAGNOSIS — I2510 Atherosclerotic heart disease of native coronary artery without angina pectoris: Secondary | ICD-10-CM | POA: Diagnosis not present

## 2024-10-21 DIAGNOSIS — Z6841 Body Mass Index (BMI) 40.0 and over, adult: Secondary | ICD-10-CM | POA: Diagnosis not present

## 2024-10-21 DIAGNOSIS — I10 Essential (primary) hypertension: Secondary | ICD-10-CM | POA: Diagnosis not present

## 2024-10-21 DIAGNOSIS — N184 Chronic kidney disease, stage 4 (severe): Secondary | ICD-10-CM | POA: Diagnosis not present

## 2024-10-31 DIAGNOSIS — R7989 Other specified abnormal findings of blood chemistry: Secondary | ICD-10-CM | POA: Diagnosis not present

## 2024-11-05 ENCOUNTER — Inpatient Hospital Stay: Attending: Hematology and Oncology | Admitting: Oncology

## 2024-11-05 ENCOUNTER — Inpatient Hospital Stay

## 2024-11-05 VITALS — BP 167/118 | HR 98 | Temp 97.9°F | Resp 20

## 2024-11-05 DIAGNOSIS — R5383 Other fatigue: Secondary | ICD-10-CM

## 2024-11-05 DIAGNOSIS — D508 Other iron deficiency anemias: Secondary | ICD-10-CM | POA: Insufficient documentation

## 2024-11-05 DIAGNOSIS — K7581 Nonalcoholic steatohepatitis (NASH): Secondary | ICD-10-CM | POA: Diagnosis not present

## 2024-11-05 DIAGNOSIS — E538 Deficiency of other specified B group vitamins: Secondary | ICD-10-CM | POA: Insufficient documentation

## 2024-11-05 DIAGNOSIS — D696 Thrombocytopenia, unspecified: Secondary | ICD-10-CM | POA: Insufficient documentation

## 2024-11-05 DIAGNOSIS — D631 Anemia in chronic kidney disease: Secondary | ICD-10-CM | POA: Insufficient documentation

## 2024-11-05 DIAGNOSIS — Z79899 Other long term (current) drug therapy: Secondary | ICD-10-CM | POA: Diagnosis not present

## 2024-11-05 DIAGNOSIS — K7460 Unspecified cirrhosis of liver: Secondary | ICD-10-CM | POA: Diagnosis not present

## 2024-11-05 DIAGNOSIS — K7469 Other cirrhosis of liver: Secondary | ICD-10-CM

## 2024-11-05 DIAGNOSIS — N189 Chronic kidney disease, unspecified: Secondary | ICD-10-CM | POA: Diagnosis not present

## 2024-11-05 LAB — CBC WITH DIFFERENTIAL/PLATELET
Abs Immature Granulocytes: 0.07 K/uL (ref 0.00–0.07)
Basophils Absolute: 0.1 K/uL (ref 0.0–0.1)
Basophils Relative: 1 %
Eosinophils Absolute: 0.3 K/uL (ref 0.0–0.5)
Eosinophils Relative: 3 %
HCT: 35.7 % — ABNORMAL LOW (ref 36.0–46.0)
Hemoglobin: 11.7 g/dL — ABNORMAL LOW (ref 12.0–15.0)
Immature Granulocytes: 1 %
Lymphocytes Relative: 39 %
Lymphs Abs: 4 K/uL (ref 0.7–4.0)
MCH: 29.8 pg (ref 26.0–34.0)
MCHC: 32.8 g/dL (ref 30.0–36.0)
MCV: 91.1 fL (ref 80.0–100.0)
Monocytes Absolute: 0.6 K/uL (ref 0.1–1.0)
Monocytes Relative: 6 %
Neutro Abs: 5.4 K/uL (ref 1.7–7.7)
Neutrophils Relative %: 50 %
Platelets: 105 K/uL — ABNORMAL LOW (ref 150–400)
RBC: 3.92 MIL/uL (ref 3.87–5.11)
RDW: 15.9 % — ABNORMAL HIGH (ref 11.5–15.5)
WBC: 10.4 K/uL (ref 4.0–10.5)
nRBC: 0 % (ref 0.0–0.2)

## 2024-11-05 LAB — IRON AND TIBC
Iron: 62 ug/dL (ref 28–170)
Saturation Ratios: 28 % (ref 10.4–31.8)
TIBC: 225 ug/dL — ABNORMAL LOW (ref 250–450)
UIBC: 163 ug/dL

## 2024-11-05 LAB — VITAMIN B12: Vitamin B-12: 950 pg/mL — ABNORMAL HIGH (ref 180–914)

## 2024-11-05 LAB — FERRITIN: Ferritin: 209 ng/mL (ref 11–307)

## 2024-11-05 MED ORDER — VITAMIN B-12 1000 MCG PO TABS: 1000.0000 ug | ORAL_TABLET | Freq: Every day | ORAL | 2 refills | Status: AC

## 2024-11-05 NOTE — Assessment & Plan Note (Addendum)
 Mild thrombocytopenia likely secondary to underlying liver disease. Monitor.  Most recent platelet count was 105. Denies any bleeding.

## 2024-11-05 NOTE — Assessment & Plan Note (Addendum)
 She denies any bright red blood per rectum, melena or hematochezia.  She has never had a colonoscopy. She received 2 doses of IV Feraheme on 09/02/2024 and 09/09/2024 with great tolerance. Repeat labs from today show a ferritin of 209, iron saturation 28% with hemoglobin 11.7.  Platelets 105. No additional IV iron needed at this time. Return to clinic in 10 weeks to discuss labs and follow-up.

## 2024-11-05 NOTE — Assessment & Plan Note (Addendum)
 Patient has had worsening fatigue over the past 6 months or so. Reports associated leg weakness and joint pain.  She does have a diagnosis of fibromyalgia. She has history of depression/anxiety.  She is currently on Cymbalta and Ativan. We discussed reaching out to PCP whom she sees next month for further recommendations. Unfortunately, fatigue is likely multifactorial.  She is mildly anemic but this is likely due to CKD and not iron deficiency.

## 2024-11-05 NOTE — Assessment & Plan Note (Signed)
 Patient is followed by Dr. Dennise with [facility]. Baseline creatinine between 2 and 3.

## 2024-11-05 NOTE — Progress Notes (Signed)
 [facility] OFFICE PROGRESS NOTE  Angela Jerel MATSU, MD  ASSESSMENT & PLAN:    Assessment & Plan Other iron deficiency anemia She denies any bright red blood per rectum, melena or hematochezia.  She has never had a colonoscopy. She received 2 doses of IV Feraheme on 09/02/2024 and 09/09/2024 with great tolerance. Repeat labs from today show a ferritin of 209, iron saturation 28% with hemoglobin 11.7.  Platelets 105. No additional IV iron needed at this time. Return to clinic in 10 weeks to discuss labs and follow-up. Liver cirrhosis secondary to NASH (nonalcoholic steatohepatitis) (HCC) Mild thrombocytopenia likely secondary to underlying liver disease. Monitor.  Most recent platelet count was 105. Denies any bleeding. Anemia of chronic renal failure, unspecified CKD stage Patient is followed by Dr. Dennise with [facility]. Baseline creatinine between 2 and 3. Other fatigue Patient has had worsening fatigue over the past 6 months or so. Reports associated leg weakness and joint pain.  She does have a diagnosis of fibromyalgia. She has history of depression/anxiety.  She is currently on Cymbalta and Ativan. We discussed reaching out to PCP whom she sees next month for further recommendations. Unfortunately, fatigue is likely multifactorial.  She is mildly anemic but this is likely due to CKD and not iron deficiency. Vitamin B12 deficiency disease Previously taking B12 supplements.  She was encouraged to stop given elevated B12 levels. Most recent B12 has decreased to less than 1000.  Recommend restarting B12 supplements every other day. Rx 1000 mcg every other day sent to her pharmacy.  Orders Placed This Encounter  Procedures   Vitamin B12    Standing Status:   Future    Expected Date:   01/14/2025    Expiration Date:   11/05/2025   Ferritin    Standing Status:   Future    Expected Date:   01/14/2025    Expiration Date:   04/14/2025   Iron and TIBC ([facility]  [facility] ONLY)    Standing Status:   Future    Expected Date:   01/14/2025    Expiration Date:   04/14/2025   CBC with Differential    Standing Status:   Future    Expected Date:   01/14/2025    Expiration Date:   04/14/2025    INTERVAL HISTORY: Patient returns for follow-up.  Reports she is still having significant fatigue.  Getting up to go to the bathroom is a job.  She tires easily.  She has lower extremity pain at all times.  Reports she rarely feels like taking a shower.  Reports appetite 75% has shortness of breath and chest pain which has significantly worsened over the past 6 months.  Reports things that she used to like to do such as grocery shopping are no longer appealing to her.  She is currently not taking B12 supplements as her B12 levels were elevated at her last visit.  She has no energy and appetite is 50%.  Reports she continues to sleep a lot and would rather lay in bed and sit on the couch.  She needs help getting to the bathroom.  Reports twitching of her legs and arms.  She has follow-up with Dr. Blondie her PCP next month.  We reviewed CBC, iron panel, ferritin and B12.  SUMMARY OF HEMATOLOGIC HISTORY: Oncology History   No history exists.     CBC    Component Value Date/Time   WBC 10.4 11/05/2024 1022   RBC 3.92 11/05/2024 1022  HGB 11.7 (L) 11/05/2024 1022   HGB 14.2 10/24/2022 1337   HCT 35.7 (L) 11/05/2024 1022   HCT 41.9 10/24/2022 1337   PLT 105 (L) 11/05/2024 1022   PLT 163 10/24/2022 1337   MCV 91.1 11/05/2024 1022   MCV 85 10/24/2022 1337   MCH 29.8 11/05/2024 1022   MCHC 32.8 11/05/2024 1022   RDW 15.9 (H) 11/05/2024 1022   RDW 14.5 10/24/2022 1337   LYMPHSABS 4.0 11/05/2024 1022   MONOABS 0.6 11/05/2024 1022   EOSABS 0.3 11/05/2024 1022   BASOSABS 0.1 11/05/2024 1022       Latest Ref Rng & Units 08/27/2024   12:22 PM 07/22/2024    2:20 PM 10/24/2022    1:37 PM  CMP  Glucose 70 - 99 mg/dL 851  820  878   BUN 6 - 20 mg/dL 52   58  32   Creatinine 0.44 - 1.00 mg/dL 7.16  7.02  6.72   Sodium 135 - 145 mmol/L 136  138  139   Potassium 3.5 - 5.1 mmol/L 4.4  4.2  4.1   Chloride 98 - 111 mmol/L 108  108  101   CO2 22 - 32 mmol/L 18  18  16    Calcium 8.9 - 10.3 mg/dL 8.3  8.4  89.8   Total Protein 6.5 - 8.1 g/dL 6.8  7.3  8.2   Total Bilirubin 0.0 - 1.2 mg/dL 0.6  0.7  0.8   Alkaline Phos 38 - 126 U/L 114  141  202   AST 15 - 41 U/L 10  10  18    ALT 0 - 44 U/L 12  14  11       Lab Results  Component Value Date   FERRITIN 209 11/05/2024   VITAMINB12 950 (H) 11/05/2024    Vitals:   11/05/24 1140  BP: (!) 167/118  Pulse: 98  Resp: 20  Temp: 97.9 F (36.6 C)  SpO2: 100%     Review of System:  Review of Systems  Constitutional:  Positive for malaise/fatigue.  HENT:  Positive for nosebleeds.   Respiratory:  Positive for shortness of breath.   Cardiovascular:  Positive for chest pain.  Gastrointestinal:  Positive for constipation, diarrhea, nausea and vomiting.  Musculoskeletal:  Positive for joint pain and myalgias. Negative for falls.  Neurological:  Positive for dizziness, tingling, sensory change, weakness and headaches.  Psychiatric/Behavioral:  The patient has insomnia.     Physical Exam: Physical Exam Constitutional:      Appearance: Normal appearance. She is obese.  HENT:     Head: Normocephalic and atraumatic.  Eyes:     Pupils: Pupils are equal, round, and reactive to light.  Cardiovascular:     Rate and Rhythm: Normal rate and regular rhythm.     Heart sounds: Normal heart sounds. No murmur heard. Pulmonary:     Effort: Pulmonary effort is normal.     Breath sounds: Normal breath sounds. No wheezing.  Abdominal:     General: Bowel sounds are normal. There is no distension.     Palpations: Abdomen is soft.     Tenderness: There is no abdominal tenderness.  Musculoskeletal:        General: Normal range of motion.     Cervical back: Normal range of motion.  Skin:    General: Skin  is warm and dry.     Findings: No rash.  Neurological:     Mental Status: She is alert and oriented to person,  place, and time.     Gait: Gait is intact.  Psychiatric:        Mood and Affect: Mood and affect normal.        Cognition and Memory: Memory normal.        Judgment: Judgment normal.     I spent 20 minutes dedicated to the care of this patient (face-to-face and non-face-to-face) on the date of the encounter to include what is described in the assessment and plan.  Delon Hope, NP 11/05/2024 12:22 PM

## 2024-11-05 NOTE — Assessment & Plan Note (Addendum)
 Previously taking B12 supplements.  She was encouraged to stop given elevated B12 levels. Most recent B12 has decreased to less than 1000.  Recommend restarting B12 supplements every other day. Rx 1000 mcg every other day sent to her pharmacy.

## 2024-11-08 DIAGNOSIS — I2510 Atherosclerotic heart disease of native coronary artery without angina pectoris: Secondary | ICD-10-CM | POA: Diagnosis not present

## 2024-11-08 DIAGNOSIS — M109 Gout, unspecified: Secondary | ICD-10-CM | POA: Diagnosis not present

## 2024-11-08 DIAGNOSIS — E1122 Type 2 diabetes mellitus with diabetic chronic kidney disease: Secondary | ICD-10-CM | POA: Diagnosis not present

## 2024-11-14 NOTE — Nursing Note (Signed)
 Discharge instructions given to patient and states understanding. IV's removed, belongings sent home with patient.

## 2024-11-14 NOTE — Discharge Summary (Signed)
 DISCHARGE SUMMARY  [facility]      Discharge date:   November 14, 2024  Length of stay:    LOS: 0 days     Discharge Service:   [facility] Hospitalists  Discharge Attending Physician: Margart Dragon, DO  Discharge to:    To Home  Condition at Discharge:  good  Code status:                         Full Code     Hospital Course:  Angela Baker. Hellberg is a 38 year old female with a history of coronary artery disease (CAD) status post LAD PCI in January 2025, stage IV chronic kidney disease (CKD), type 2 diabetes mellitus, hypertension, hyperlipidemia, cirrhosis, and morbid obesity, who was admitted on 11/14/2024 for evaluation of acute chest pain.    PROBLEM-ORIENTED HOSPITAL SUMMARY    **1. Chest Pain / Coronary Artery Disease**  She presented with two days of intermittent, pressure-like, mid-sternal chest pain, initially responsive to nitroglycerin  but later refractory to two sublingual doses at home, prompting ED evaluation. Associated symptoms included dizziness, blurred vision, and dry mouth. She has a history of complex multivessel CAD with prior PCI to the proximal-to-mid LAD and residual subtotal OM2 occlusion. On arrival, her pain was 10/10 and not relieved by home nitroglycerin . In the ED, she received IV nitroglycerin  (discontinued due to hypotension), IV heparin , aspirin , and morphine , with subsequent resolution of chest pain. Serial EKGs showed sinus tachycardia with nonspecific ST/T changes but no significant ischemic changes; serial troponins were negative. Cardiology was consulted, and after discussion with her prior interventionalist, the consensus was for medical management and close outpatient follow-up, with possible intervention on the OM2 lesion if symptoms recur or worsen. She was scheduled for follow-up at [facility] on 12/02/2024. She remained chest pain free during hospitalization.    **2. Stage IV Chronic Kidney Disease**  She has advanced CKD with baseline creatinine 3.78 mg/dL and eGFR 15 fO/fpw/8.26f??. This limited diagnostic options (unable to perform CTA for PE protocol) and required renal dosing of medications. She received gentle IV fluids as indicated and nephrotoxin avoidance was emphasized.    **3. Systemic Inflammatory Response Syndrome (SIRS) / Elevated D-dimer**  She was noted to have leukocytosis (WBC 13.1), tachycardia, and an elevated D-dimer (4168 ng/mL). CT chest/abdomen/pelvis without contrast showed no acute findings; VQ scan was normal, ruling out PE. Blood cultures were obtained.    **4. Hypomagnesemia**  She was found to have severe hypomagnesemia (Mg 0.9 mg/dL) on admission, which was repleted intravenously with improvement in levels.    **5. Hypertension**  She has a history of hypertension, with blood pressures during admission ranging from 88/55 to 113/81 mmHg. Antihypertensive therapy was continued.    **6. Type 2 Diabetes Mellitus**  She has longstanding T2DM managed with insulin  glargine and insulin  aspart, with inpatient glucose monitoring.    **7. Cirrhosis**  Imaging revealed a nodular liver consistent with cirrhosis and mild splenomegaly; she is not a candidate for renal transplant due to this comorbidity.    **8. Dysphagia / GERD / Gastroparesis / Chronic Constipation**  She reported chronic dysphagia, sensation of food getting stuck, and constipation, with a history of gastroparesis. She has not followed up with GI for recommended esophageal dilation. Outpatient GI referral was advised for further evaluation.    **9. Medication Adjustments for Renal Function**  Famotidine  was dose-reduced from 20 mg BID to 10 mg daily, and tramadol  was reduced from every 4  hours to every 12 hours as needed, both due to renal impairment.    **10. Other Chronic Conditions**  Her history includes hypothyroidism (on levothyroxine ), hyperlipidemia (on high-intensity statin), gout, asthma, generalized anxiety disorder, major depressive disorder, and morbid obesity. These were managed per home regimen during admission.    **11. Tobacco Use**  She has a history of tobacco use, recently reduced to half a pack per day; cessation was reinforced during hospitalization.    **12. Observation Status**  She was admitted under observation status due to anticipated hospital course and payer requirements.    **13. Allergies**  She has documented allergies to doxycycline , sulfa antibiotics, hydrochlorothiazide, and ketorolac.    **14. Discontinued Medications**  IV nitroglycerin  was discontinued due to hypotension.    **15. Follow-up**  She was scheduled for close outpatient cardiology follow-up to reassess for possible intervention on the OM2 lesion.    This summary reflects the key events and management decisions during her hospitalization and highlights issues requiring ongoing attention and follow-up.    ______________________________________    Please see problem generated hospital course above.     Discharge physical exam:   Alert and oriented, no further chest pain since admission  Lungs clear to auscultation bilaterally  Regular rate and rhythm, no murmur  Abdominal exam benign, normal bowel sounds, no rebound or guarding   Extremities without any edema    ______________________________________    Mental Status On day of Discharge:   The patient is Alert and oriented to PERSON  The patient is Alert And oriented to TIME  The patient is Alert and oriented to LOCATION    CODE STATUS :                    Full Code     An advanced care planning discussion was not had with patient and/or patient's decisions maker (documented separately).    Patient discharged on Home O2? - no  Patient discharged on home anticoagulant? -  no    Foley Catheter status: None  Central Line Status: NONE    Time Spent on Discharge  I spent greater than 30 minutes counseling and coordinating care for the discharge of this patient.  The patient and I discussed the importance of outpatient follow-up as well as concerning signs and symptoms that would require immediate evaluation by a medical professional.  The aforementioned conversation participants understand  and did show insight.  I did use teachback to ensure understanding.  The above participant/s is aware that not following the discussed plan, recommendations, and follow up can lead to severe negative effects on the patient's health, up to and including death.    Discharge Medications        Your Medication List        CONTINUE taking these medications      ACCU-CHEK GUIDE GLUCOSE METER Misc  Generic drug: blood-glucose meter  Use as directed.     ACCU-CHEK GUIDE TEST STRIPS Strp  Generic drug: blood sugar diagnostic  Use 1 strips every day to monitor you blood sugars     ACCU-CHEK SOFTCLIX LANCETS lancets  Generic drug: lancets  Use every day to monitor you blood sugars     acetaminophen  500 MG tablet  Commonly known as: TYLENOL   Take 3 tablets (1,500 mg total) by mouth every six (6) hours as needed.     aspirin  81 MG tablet  Commonly known as: ECOTRIN  Take 1 tablet (81 mg total) by mouth in the  morning.     atorvastatin  80 MG tablet  Commonly known as: LIPITOR   Take 1 tablet (80 mg total) by mouth in the morning.     BD NANO 2ND GEN PEN NEEDLE 32 gauge x 5/32 (4 mm) Ndle  Generic drug: pen needle, diabetic  USE 1 PEN NEEDLE DAILY     BD REGULAR BEVEL NEEDLES 27 gauge x 1/2 Ndle  Generic drug: needle (disp) 27 gauge  Use 1 each for injection every three (3) months.     BD REGULAR BEVEL NEEDLES 18 gauge x 1 1/2 Ndle  Generic drug: needle (disp) 18 G  Use 1 each for injection every three (3) months.     carvedilol  25 MG tablet  Commonly known as: COREG   Take 0.5 tablets (12.5 mg total) by mouth two (2) times a day.     cetirizine  10 MG chewable tablet  Commonly known as: ZYRTEC   Chew 1 tablet (10 mg total) in the morning.     clopidogrel  75 mg tablet  Commonly known as: PLAVIX   Take 1 tablet (75 mg total) by mouth daily.     DULoxetine  60 MG capsule  Commonly known as: CYMBALTA   Take 1 capsule (60 mg total) by mouth in the morning.     EASY TOUCH LUER LOCK SYRINGE 1 mL Syrg  Generic drug: syringe (disposable)  Use 1 each for injection every three (3) months.     empty container Misc  Use as directed.     famotidine  20 MG tablet  Commonly known as: PEPCID   Take 1 tablet (20 mg total) by mouth two (2) times a day for 15 days.     febuxostat  40 mg tablet  Commonly known as: ULORIC   Take 1 tablet (40 mg total) by mouth daily.     gabapentin  300 MG capsule  Commonly known as: NEURONTIN   Take 1 capsule (300 mg total) by mouth Three (3) times a day.     ILARIS  (PF) 150 mg/mL injection  Generic drug: canakinumab  (PF)  Inject 1 mL (150 mg total) under the skin Every three (3) months.     insulin  aspart 100 unit/mL vial  Commonly known as: NovoLOG  Inject under the skin Three (3) times a day before meals.     isosorbide  mononitrate 30 MG 24 hr tablet  Commonly known as: IMDUR   TAKE 2 TABLETS BY MOUTH DAILY. MAY take additional TABLET FOR CHEST pain     LANTUS SOLOSTAR U-100 INSULIN  100 unit/mL (3 mL) injection pen  Generic drug: insulin  glargine     levothyroxine  200 MCG tablet  Commonly known as: SYNTHROID   Take 1 tablet (200 mcg total) by mouth every morning.     linaclotide 145 mcg capsule  Commonly known as: LINZESS  Take 1 capsule (145 mcg total) by mouth daily as needed.     LORazepam  1 MG tablet  Commonly known as: ATIVAN   Take 1 tablet (1 mg total) by mouth in the morning.     MITIGARE 0.6 mg Cap capsule  Generic drug: colchicine  Take 1 capsule (0.6 mg total) by mouth in the morning.     ondansetron  8 MG tablet  Commonly known as: ZOFRAN   Take 1 tablet (8 mg total) by mouth every eight (8) hours as needed for nausea.     pantoprazole  40 MG tablet  Commonly known as: Protonix   Take 1 tablet (40 mg total) by mouth.            _____________________________________  Nutrition:                                   Diet Instructions       Discharge diet (specify)      Discharge Nutrition Therapy: Heart Healthy                                ___________________________________________    Discharge Instructions     Nutrition:                                   Diet Instructions       Discharge diet (specify)      Discharge Nutrition Therapy: Heart Healthy           Activity:                                    Activity Instructions       Activity as tolerated             Appointments:                          Appointments which have been scheduled for you      Nov 22, 2024 2:00 PM  (Arrive by 1:45 PM)  RETURN RHEUMATOLOGY with Cooper Gerhardt Blush, PA  [facility] RHEUMATOLOGY SPECIALTY [place] [facility]) [address]  [facility] [address] through [address]  [place] [address]  [phone]        Dec 02, 2024 2:40 PM  (Arrive by 2:10 PM)  RETURN CARDIOLOGY with CARDIO [place] PROVIDER  [facility] CARDIOLOGY [place] [facility] [place]) [address]  [place] [address]  [phone]        Jan 14, 2025 3:30 PM  (Arrive by 3:15 PM)  RETURN RHEUMATOLOGY with Lavanda Ezzard Forte, MD  [facility] RHEUMATOLOGY SPECIALTY [place] [facility] [facility]) [address]  [facility] 1 through [address]  [place] [address]  [phone]              Follow Up:                              Follow Up instructions and Outpatient Referrals     Discharge instructions             Allergies   Allergen Reactions    Doxycycline  Hives    Sulfa (Sulfonamide Antibiotics) Hives    Hydrochlorothiazide Other (See Comments)     Unsure of allergy    Toradol [Ketorolac] Other (See Comments)     Kidney problems        Past Medical History[1]    Past Surgical History[2]     Family History[3]     Current Medications[4]    Imaging   NM Lung Ventilation Perfusion  Result Date: 11/14/2024  Exam:  Radionuclide Ventilation Perfusion Scan  History:  38 year old with chest pain concerning for pulmonary embolus.  Technique: Radiopharmaceutical: 39.2 mCi of Tc-17m DTPA aerosol administered via mouthpiece during tidal breathing followed by anterior, posterior, lateral and oblique views for the ventilation  portion of the examination.  Radiopharmaceutical: 4.7 mCi of Tc-77m MAA was administered intravenously followed by planar anterior, posterior, lateral and oblique views for the perfusion portion of the examination.  Comparison:  Chest x-ray from 11/14/2024 and chest CT from 11/14/2024  Findings:  VENTILATION:  Normal.  PERFUSION:  Normal.  CHEST X-RAY CORRELATION:  Chest x-ray appears normal with no focal consolidation.      Normal V/Q scan.  Signed (Electronic Signature): 11/14/2024 11:49 AM Signed By: Ozell JONETTA Quale, MD    ECG 12 Lead  Result Date: 11/14/2024  Normal sinus rhythm Abnormal QRS-T angle, consider primary T wave abnormality Prolonged QT Abnormal ECG When compared with ECG of 14-Nov-2024 04:11, Nonspecific T wave abnormality no longer evident in Inferior leads Confirmed by Cherie Searle ([id number]) on 11/14/2024 11:47:42 AM    CT Chest Abdomen Pelvis Wo Contrast  Result Date: 11/14/2024  Exam: CT of the Chest, Abdomen and Pelvis without Contrast  History: Sepsis, squeezing chest pain  Technique: Routine CT of the chest, abdomen and pelvis without IV contrast. AEC (automated exposure control) and/or manual techniques such as size-specific kV and mAs are employed where appropriate to reduce radiation exposure for all CT exams.  Comparison: 09/27/2024, 06/23/2023, 06/22/2022, 10/31/2017 CTs, ultrasound from 01/12/2023  Findings: LUNGS: Minimal dependent atelectasis noted otherwise the lungs are clear.  PLEURA: No pleural effusion or pneumothorax.  HEART: Normal heart size. No pericardial effusion. Marked coronary artery calcifications are evident  GREAT VESSELS: Thoracic aorta and main pulmonary artery normal caliber.  MEDIASTINUM: No lymphadenopathy.  LIVER:  Liver is nodular consistent with cirrhosis. No focal hepatic lesion is evident.  BILIARY: Gallbladder is contracted No significant intra- or extrahepatic biliary ductal dilation.  PANCREAS:  Normal background parenchyma. No focal lesion. Main duct normal in caliber.  SPLEEN:  Spleen is mildly enlarged measuring 15 cm in craniocaudal length.  ADRENALS:  Normal morphology.  KIDNEYS/URETERS:  Minimal bilateral nephrolithiasis noted. No hydronephrosis or solid mass.  BOWEL:  No bowel obstruction. No focal inflammatory process. Normal appendix  VASCULAR:  Normal caliber. No significant atherosclerosis.  LYMPH NODES:  No lymphadenopathy.  PERITONEUM: No ascites.  PELVIC ORGANS:  Prominent enlarged ovaries, similar to multiple prior studies.  BONES:  Bilateral spondylolysis noted at L5 without significant spondylolisthesis. Serpiginous sclerosis in the femoral heads consistent with avascular necrosis  SOFT TISSUES: Normal.       1. No acute finding in the chest. Marked coronary artery calcifications  2. No acute finding in the abdomen or pelvis. Marked chronic enlargement of the ovaries, hepatic cirrhosis  Signed (Electronic Signature): 11/14/2024 5:34 AM Signed By: Luke Batter    XR Chest Portable  Result Date: 11/14/2024  Exam:  Portable Chest  History:  Chest pain    Technique: Single frontal view of the chest.  Comparison:  06/23/2023    Findings:  No airspace consolidation or focal infiltrate. There is no visible effusion. Mediastinum is unremarkable. Pulmonary vasculature appears normal.          Normal chest      Signed (Electronic Signature): 11/14/2024 2:38 AM Signed By: Luke Batter      Lab Results     Recent Labs     11/14/24  0215   WBC 13.1*   HGB 11.9   HCT 34.7   PLT 120*     Recent Labs     11/14/24  0215 11/14/24  1059   NA 139  --    K 3.9  --  CL 104  --    CO2 20.8*  --    BUN 21*  --    CREATININE 3.78*  --    GLU 191*  --    CALCIUM 8.3*  --    ALBUMIN  3.2*  --    PROT 6.5  --    BILITOT 1.1  --    AST 20  --    ALT 20  --    ALKPHOS 143*  --    MG 0.9* 1.5*     Recent Labs     11/14/24  0215 11/14/24  0323 11/14/24  0346 11/14/24  0813 TROPONINI 21  --    < > 20   INR 1.18 1.14  --   --    APTT 31.5 30.6  --   --    DDIMER 4,168*  --   --   --     < > = values in this interval not displayed.     No results for input(s): WBCUA, NITRITE, LEUKOCYTESUR, BACTERIA, RBCUA, BLOODU, GLUCOSEU, PROTEINUA, KETONESU, KETUR in the last 72 hours.  No results for input(s): OPIAU, BENZU, TRICYCLIC, PCPU, AMPHU, COCAU, CANNAU, BARBU, ETOH, ACETAMIN, SALICYLATE in the last 72 hours.  No results for input(s): PREGTESTUR, PREGPOC in the last 72 hours.  No results for input(s): OCCULTBLD, RAPSCRN, CDIFRPCR, CDIFFNAP1, A1C, CHOL, LDL, HDL, TRIG in the last 72 hours.  No results for input(s): O2SOUR, FIO2ART, PHART, PCO2ART, PO2ART, HCO3ART, O2SATART, BEART in the last 72 hours.  Pending Labs       Order Current Status    Blood Culture In process    Blood Culture In process            Home Medications     Prior to Admission medications   Medication Dose, Route, Frequency   acetaminophen  (TYLENOL ) 500 MG tablet 1,500 mg, Every 6 hours PRN   aspirin  (ECOTRIN) 81 MG tablet 81 mg, Daily (standard)   atorvastatin  (LIPITOR ) 80 MG tablet 80 mg, Daily (standard)   canakinumab , PF, (ILARIS ) 150 mg/mL injection 150 mg, Subcutaneous, Every 3 months   carvedilol  (COREG ) 25 MG tablet 12.5 mg, Oral, 2 times a day (standard)   cetirizine  (ZYRTEC ) 10 MG chewable tablet 10 mg, Daily (standard)   clopidogrel  (PLAVIX ) 75 mg tablet 75 mg, Oral, Daily (standard)   DULoxetine  (CYMBALTA ) 60 MG capsule 60 mg, Daily (standard)   famotidine  (PEPCID ) 20 MG tablet 20 mg, Oral, 2 times a day (standard)   febuxostat  (ULORIC ) 40 mg tablet 40 mg, Oral, Daily (standard)   gabapentin  (NEURONTIN ) 300 MG capsule 300 mg, 3 times a day (standard)   insulin  aspart (NOVOLOG) 100 unit/mL vial 3 times a day (AC)   isosorbide  mononitrate (IMDUR ) 30 MG 24 hr tablet TAKE 2 TABLETS BY MOUTH DAILY. MAY take additional TABLET FOR CHEST pain   LANTUS SOLOSTAR U-100 INSULIN  100 unit/mL (3 mL) injection pen    levothyroxine  (SYNTHROID ) 200 MCG tablet 200 mcg, Every morning   linaclotide (LINZESS) 145 mcg capsule 145 mcg, Daily PRN   LORazepam  (ATIVAN ) 1 MG tablet 1 mg, Daily (standard)   MITIGARE 0.6 mg cap capsule 0.6 mg, Daily (standard)   ondansetron  (ZOFRAN ) 8 MG tablet 8 mg, Every 8 hours PRN   pantoprazole  (PROTONIX ) 40 MG tablet 40 mg   ACCU-CHEK GUIDE GLUCOSE METER Misc Use as directed.   ACCU-CHEK GUIDE TEST STRIPS Strp Use 1 strips every day to monitor you blood sugars   ACCU-CHEK SOFTCLIX LANCETS lancets  Use every day to monitor you blood sugars   BD NANO 2ND GEN PEN NEEDLE 32 gauge x 5/32 (4 mm) Ndle USE 1 PEN NEEDLE DAILY   empty container Misc Use as directed.   syringe, disposable, (BD LUER-LOK SYRINGE) 1 mL Syrg Use 1 each for injection every three (3) months.   needle, disp, 18 G 18 gauge x 1 1/2 Ndle Use 1 each for injection every three (3) months.   needle, disp, 27 gauge 27 gauge x 1/2 Ndle Use 1 each for injection every three (3) months.     Margart Dragon, DO  Hospitalist, [facility]  11/14/24, 3:50 PM       [1]   Past Medical History:  Diagnosis Date    Anxiety     Asthma (HHS-HCC)     Cirrhosis of liver    (CMS-HCC)     Cystitis     Depression     Disease of thyroid gland     GERD (gastroesophageal reflux disease)     Gout 12/2017    History of palpitations     Hypercholesterolemia     Hypertension     Hypothyroidism     Insomnia     MI (myocardial infarction) (CMS-HCC)     Obese     Polycystic ovary syndrome     Sciatica     Stage 4 chronic kidney disease    (CMS-HCC)     Type 2 diabetes mellitus (CMS-HCC)    [2]   Past Surgical History:  Procedure Laterality Date    DIAGNOSTIC LAPAROSCOPY  02/01/2017    With Chromotubation    FRACTURE SURGERY      PR CATH PLACE/CORON ANGIO, IMG SUPER/INTERP,W LEFT HEART VENTRICULOGRAPHY Left 12/22/2023    Procedure: Left Heart Catheterization;  Surgeon: Gil Franky Barter, MD;  Location: [facility];  Service: Cardiology    PR PRQ TRLUML CORONARY CLEDA MENA ONE ART/BRNCH N/A 12/29/2023    Procedure: Percutaneous Coronary Intervention;  Surgeon: Gil Franky Barter, MD;  Location: [facility] CATH;  Service: Cardiology   [3]   Family History  Problem Relation Age of Onset    Cancer Mother     Diabetes Father     Hypertension Father     Heart disease Father     Stroke Sister    [4]   Current Facility-Administered Medications:     acetaminophen  (TYLENOL ) tablet 650 mg, 650 mg, Oral, Q6H PRN, Dragon Margart Fallow, DO    [START ON 11/15/2024] aspirin  (ECOTRIN) tablet 81 mg, 81 mg, Oral, Daily, Verdie Darin Shelvy Lonni, MD    atorvastatin  (LIPITOR ) tablet 80 mg, 80 mg, Oral, Daily, Verdie Darin Shelvy Lonni, MD, 80 mg at 11/14/24 1228    carvedilol  (COREG ) tablet 12.5 mg, 12.5 mg, Oral, BID, Verdie Darin Shelvy Lonni, MD    cetirizine  (ZYRTEC ) tablet 10 mg, 10 mg, Oral, Daily, Verdie Darin Shelvy Lonni, MD, 10 mg at 11/14/24 1229    clopidogrel  (PLAVIX ) tablet 75 mg, 75 mg, Oral, Daily, Verdie Darin Shelvy Lonni, MD, 75 mg at 11/14/24 1229    dextrose  (GLUTOSE) 40 % gel 15 g of dextrose , 15 g of dextrose , Oral, Q10 Min PRN, Verdie Darin Shelvy Lonni, MD    dextrose  50 % in water (D50W) 50 % solution 12.5 g, 12.5 g, Intravenous, Q15 Min PRN, Verdie Darin Shelvy Lonni, MD    DULoxetine  (CYMBALTA ) DR capsule 60 mg, 60 mg, Oral, Daily, Verdie Darin Shelvy Lonni, MD, 60 mg at 11/14/24 1228  famotidine  (PEPCID ) tablet 10 mg, 10 mg, Oral, Daily, Feliz Margart Fallow, DO, 10 mg at 11/14/24 1229    febuxostat  (ULORIC ) tablet 20 mg, 20 mg, Oral, Daily, Feliz Margart Fallow, DO, 20 mg at 11/14/24 1229    gabapentin  (NEURONTIN ) capsule 300 mg, 300 mg, Oral, TID, Verdie Darin Shelvy Bruckner, MD, 300 mg at 11/14/24 1229    glucagon injection 1 mg, 1 mg, Intramuscular, Once PRN, Verdie Darin Shelvy Bruckner, MD    guaiFENesin (ROBITUSSIN) oral syrup, 200 mg, Oral, Q4H PRN, Verdie Darin Shelvy Bruckner, MD    isosorbide  mononitrate (IMDUR ) 24 hr tablet 30 mg, 30 mg, Oral, Daily, Verdie Andre St. Christopher, MD, 30 mg at 11/14/24 1229    levothyroxine  (SYNTHROID ) tablet 200 mcg, 200 mcg, Oral, QAM, Dyer, Andre St. Christopher, MD, 200 mcg at 11/14/24 1229    LORazepam  (ATIVAN ) tablet 1 mg, 1 mg, Oral, Daily, Verdie Darin Shelvy Bruckner, MD, 1 mg at 11/14/24 1228    morphine  4 mg/mL injection 2 mg, 2 mg, Intravenous, Q4H PRN, Verdie Darin Shelvy Bruckner, MD, 2 mg at 11/14/24 1248    nitroglycerin  (NITROSTAT ) SL tablet 0.4 mg, 0.4 mg, Sublingual, Q5 Min PRN, Verdie Darin Shelvy Bruckner, MD    nitroglycerin  50 mg in dextrose  5% 250 mL (200 mcg/mL) infusion PMB, 0-300 mcg/min, Intravenous, Continuous, Verdie Darin Shelvy Bruckner, MD, Paused at 11/14/24 0507    ondansetron  (ZOFRAN ) injection 4 mg, 4 mg, Intravenous, Q8H PRN **OR** ondansetron  (ZOFRAN ) injection 8 mg, 8 mg, Intravenous, Q8H PRN, Verdie Darin Shelvy Bruckner, MD    pantoprazole  (Protonix ) EC tablet 40 mg, 40 mg, Oral, Daily before breakfast, Verdie Darin Shelvy Bruckner, MD, 40 mg at 11/14/24 1229    prochlorperazine  (COMPAZINE ) injection 2.5 mg, 2.5 mg, Intravenous, Q8H PRN, 2.5 mg at 11/14/24 0808 **OR** prochlorperazine  (COMPAZINE ) injection 5 mg, 5 mg, Intravenous, Q8H PRN, Verdie Darin Shelvy Bruckner, MD    sodium chloride  (NS) 0.9 % infusion, 125 mL/hr, Intravenous, Continuous, Verdie Darin Shelvy Bruckner, MD, Held at 11/14/24 [phone]    traMADol  (ULTRAM ) tablet 50 mg, 50 mg, Oral, Q12H PRN, Feliz Margart Fallow, DO    Current Outpatient Medications:     acetaminophen  (TYLENOL ) 500 MG tablet, Take 3 tablets (1,500 mg total) by mouth every six (6) hours as needed., Disp: , Rfl:     aspirin  (ECOTRIN) 81 MG tablet, Take 1 tablet (81 mg total) by mouth in the morning., Disp: , Rfl:     atorvastatin  (LIPITOR ) 80 MG tablet, Take 1 tablet (80 mg total) by mouth in the morning., Disp: , Rfl:     canakinumab , PF, (ILARIS ) 150 mg/mL injection, Inject 1 mL (150 mg total) under the skin Every three (3) months., Disp: 1 mL, Rfl: 3    carvedilol  (COREG ) 25 MG tablet, Take 0.5 tablets (12.5 mg total) by mouth two (2) times a day., Disp: 90 tablet, Rfl: 3    cetirizine  (ZYRTEC ) 10 MG chewable tablet, Chew 1 tablet (10 mg total) in the morning., Disp: , Rfl:     clopidogrel  (PLAVIX ) 75 mg tablet, Take 1 tablet (75 mg total) by mouth daily., Disp: 30 tablet, Rfl: 11    DULoxetine  (CYMBALTA ) 60 MG capsule, Take 1 capsule (60 mg total) by mouth in the morning., Disp: , Rfl:     famotidine  (PEPCID ) 20 MG tablet, Take 1 tablet (20 mg total) by mouth two (2) times a day for 15 days., Disp: 30 tablet, Rfl: 0    febuxostat  (ULORIC ) 40 mg tablet, Take  1 tablet (40 mg total) by mouth daily., Disp: 30 tablet, Rfl: 3    gabapentin  (NEURONTIN ) 300 MG capsule, Take 1 capsule (300 mg total) by mouth Three (3) times a day., Disp: , Rfl:     insulin  aspart (NOVOLOG) 100 unit/mL vial, Inject under the skin Three (3) times a day before meals., Disp: , Rfl:     isosorbide  mononitrate (IMDUR ) 30 MG 24 hr tablet, TAKE 2 TABLETS BY MOUTH DAILY. MAY take additional TABLET FOR CHEST pain, Disp: 90 tablet, Rfl: 1    LANTUS SOLOSTAR U-100 INSULIN  100 unit/mL (3 mL) injection pen, , Disp: , Rfl:     levothyroxine  (SYNTHROID ) 200 MCG tablet, Take 1 tablet (200 mcg total) by mouth every morning., Disp: , Rfl:     linaclotide (LINZESS) 145 mcg capsule, Take 1 capsule (145 mcg total) by mouth daily as needed., Disp: , Rfl:     LORazepam  (ATIVAN ) 1 MG tablet, Take 1 tablet (1 mg total) by mouth in the morning., Disp: , Rfl: 0    MITIGARE 0.6 mg cap capsule, Take 1 capsule (0.6 mg total) by mouth in the morning., Disp: , Rfl:     ondansetron  (ZOFRAN ) 8 MG tablet, Take 1 tablet (8 mg total) by mouth every eight (8) hours as needed for nausea., Disp: , Rfl:     pantoprazole  (PROTONIX ) 40 MG tablet, Take 1 tablet (40 mg total) by mouth., Disp: , Rfl:     ACCU-CHEK GUIDE GLUCOSE METER Misc, Use as directed., Disp: , Rfl:     ACCU-CHEK GUIDE TEST STRIPS Strp, Use 1 strips every day to monitor you blood sugars, Disp: , Rfl:     ACCU-CHEK SOFTCLIX LANCETS lancets, Use every day to monitor you blood sugars, Disp: , Rfl:     BD NANO 2ND GEN PEN NEEDLE 32 gauge x 5/32 (4 mm) Ndle, USE 1 PEN NEEDLE DAILY, Disp: , Rfl:     empty container Misc, Use as directed., Disp: 1 each, Rfl: 2    syringe, disposable, (BD LUER-LOK SYRINGE) 1 mL Syrg, Use 1 each for injection every three (3) months., Disp: 4 each, Rfl: 0    needle, disp, 18 G 18 gauge x 1 1/2 Ndle, Use 1 each for injection every three (3) months., Disp: 4 each, Rfl: 0    needle, disp, 27 gauge 27 gauge x 1/2 Ndle, Use 1 each for injection every three (3) months., Disp: 4 each, Rfl: 0

## 2024-12-09 ENCOUNTER — Encounter: Payer: Self-pay | Admitting: *Deleted

## 2024-12-23 NOTE — Progress Notes (Signed)
 " [facility] [address]  Phone: [phone] [place], [place] [address]  Fax: [phone]  Date of Service: 12/23/24  Assessment/Plan:  CAD s/p LAD PCI 12/29/2023 HTN HLD -ASA 81 mg daily -clopidogrel 75 mg daily -atorvastatin 80 mg daily -carvedilol 25 mg daily -lisinopril 10 mg daily -ISMN 30 mg daily - Will proceed with staged PCI of residual OM 2 disease given symptoms now. Will plan for PCI at [facility].  -Blood pressure 162/86 today patient has not been taking any of her medications due to issues with the pharmacy.  Advised her to resume all medications and to keep a blood pressure log at home of which she should bring to each visit to optimize her blood pressure regimen. - She continues to smoke  Return to clinic: Return after PCI   Ozell Stands, DO, [facility], [facility]   This chart has been completed using Dragon Medical Dictation software, and while attempts have been made to ensure accuracy, certain words and phrases may not be transcribed as intended.  Subjective:  ERE:Ijwpzo, Jerel Area, MD Patient ID: Angela Baker is an 39 y.o. female patient with multivessel CAD s/p LAD PCI 12/29/2023, HTN, HLD, CKD stage IV, DM2, tobacco use, here for follow-up.   History of Present Illness Pt was last seen on 02/27/2024. Post-procedure, there is significant improvement in chest pain with only one minor episode of chest tightness lasting about five minutes. Shortness of breath persists, particularly with activities such as showering or walking short distances, necessitating rest. She overall feels better compared to before PCI but still struggles with activities any more strenuous than walking short distances. She has had trouble getting in to see a Rheumatology specialist for her gout, but is going to see one of my colleagues in [place] in September.  Interval Events 12/23/24: For the past 2 weeks has been having increased dyspnea on exertion and  shortness of breath.  States that her husband had bronchitis this past Wednesday and she thought that she was infected as well and went to her family medicine doctor who prescribed her steroids and antibiotics.  However she has not felt better since and has been noticing decreasing exertional capacity over the last couple of weeks.  She does get occasional palpitations with these episodes and is now unable to even shower without getting short of breath.  She gets occasional chest tightness with these episodes.  Denies dizziness, syncope, lower extremity edema, cough, fever, numbness, tingling, paresthesias.  She has not been taking her blood pressure medications due to issues with the pharmacy and as a result her blood pressure is elevated today.  Denies headache vision changes  Objective:  Vital Signs Visit Vitals BP (!) 162/86 (BP Site: L Arm, BP Position: Sitting, BP Cuff Size: Medium)  Pulse 108  Temp 36.3 C (97.3 F) (Temporal)  Resp 16   Physical Exam CHEST: Clear to auscultation bilaterally, no wheezes, rhonchi, or crackles. CARDIOVASCULAR: Normal heart rate and rhythm, S1 and S2 normal without murmurs. JVP difficult to appreciate due to body habitus. RESPIRATORY: clear to auscultation bilaterally, no wheezes or ronchi EXTREMITIES: No cyanosis or edema. SKIN: White nodules on bilateral hands around finger joints.  Results LABS Hb: 11.2 g/dL (97/75/7974) PLT: 898 k89^0/O (02/05/2024) Hct: 34.6% (02/05/2024) Cr: 2.7 mg/dL (97/75/7974) K: 4.1 mmol/L (02/05/2024) HbA1c: 6.8% (02/05/2024) Total cholesterol: 115 mg/dL (97/75/7974) LDL cholesterol: 44 mg/dL (97/75/7974) HDL cholesterol: 30 mg/dL (97/75/7974) Triglycerides: 267 mg/dL (97/75/7974)  DIAGNOSTIC Ambulatory heart monitor: Sinus rhythm, sinus rate 62-143 bpm, average 98 bpm, rare PACs, no high-grade AV block, no atrial fibrillation, patient-initiated recordings correlated with sinus tachycardia (02/27/2024 to  03/03/2024)  ECG 12/23/2024: Sinus tachycardia, lateral T wave inversions  Left heart cath PCI 12/22/2023: Findings: Severe stenosis of the mid LAD at the bifurcation of the large D1 branch (of which has a small aneurysmal segment proximally) and CTO of the left circumflex/OM branch. Successful IVUS guided PCI to the proximal to mid LAD with a 3 x 28mm Synergy DES, post dilated proximally to 4mm. Excellent angiographic result with TIMI 3 flow.  Brief attempt to cross left circumflex/OM occlusion however given contrast restraints due to CKD, decision was made stage intervention.  Hemostasis of RRA with TR band.  TTE 07/01/2023: Impression Left Ventricle: Systolic function is normal. EF: 65-70%.   Left Ventricle: There is mild concentric hypertrophy.   Left Ventricle: Doppler parameters consistent with mild diastolic dysfunction and low to normal LA pressure.   Right Ventricle: Systolic function is normal.  "

## 2025-01-08 ENCOUNTER — Encounter: Payer: Self-pay | Admitting: Oncology

## 2025-01-16 ENCOUNTER — Encounter: Payer: Self-pay | Admitting: Oncology

## 2025-01-17 ENCOUNTER — Encounter: Payer: Self-pay | Admitting: Internal Medicine

## 2025-02-07 ENCOUNTER — Inpatient Hospital Stay: Attending: Hematology and Oncology

## 2025-02-14 ENCOUNTER — Inpatient Hospital Stay: Attending: Hematology and Oncology | Admitting: Oncology
